# Patient Record
Sex: Female | Born: 1985
Health system: Southern US, Community
[De-identification: ages and names within clinical notes are randomized; demographics above are authoritative.]

## PROBLEM LIST (undated history)

## (undated) DIAGNOSIS — N83209 Unspecified ovarian cyst, unspecified side: Secondary | ICD-10-CM

## (undated) DIAGNOSIS — N838 Other noninflammatory disorders of ovary, fallopian tube and broad ligament: Secondary | ICD-10-CM

## (undated) DIAGNOSIS — F32A Depression, unspecified: Secondary | ICD-10-CM

## (undated) HISTORY — DX: Other noninflammatory disorders of ovary, fallopian tube and broad ligament: N83.8

## (undated) HISTORY — PX: OTHER SURGICAL HISTORY: SHX169

## (undated) HISTORY — PX: TUBAL LIGATION: SHX77

## (undated) HISTORY — DX: Unspecified ovarian cyst, unspecified side: N83.209

## (undated) HISTORY — DX: Depression, unspecified: F32.A

---

## 2006-07-11 ENCOUNTER — Inpatient Hospital Stay: Payer: Self-pay | Admitting: Unknown Physician Specialty

## 2006-07-20 ENCOUNTER — Ambulatory Visit: Payer: Self-pay | Admitting: Unknown Physician Specialty

## 2006-08-02 ENCOUNTER — Emergency Department: Payer: Self-pay | Admitting: Emergency Medicine

## 2007-02-24 ENCOUNTER — Emergency Department: Payer: Self-pay | Admitting: Emergency Medicine

## 2007-03-24 ENCOUNTER — Emergency Department: Payer: Self-pay | Admitting: Emergency Medicine

## 2007-06-19 ENCOUNTER — Ambulatory Visit: Payer: Self-pay | Admitting: Unknown Physician Specialty

## 2007-06-21 ENCOUNTER — Ambulatory Visit: Payer: Self-pay | Admitting: Unknown Physician Specialty

## 2007-06-30 ENCOUNTER — Emergency Department: Payer: Self-pay | Admitting: Emergency Medicine

## 2008-05-22 ENCOUNTER — Emergency Department: Payer: Self-pay | Admitting: Emergency Medicine

## 2009-07-31 ENCOUNTER — Emergency Department: Payer: Self-pay | Admitting: Unknown Physician Specialty

## 2010-01-24 ENCOUNTER — Inpatient Hospital Stay: Payer: Self-pay | Admitting: Specialist

## 2010-12-14 ENCOUNTER — Ambulatory Visit: Payer: Self-pay

## 2011-01-20 ENCOUNTER — Ambulatory Visit: Payer: Self-pay | Admitting: Family Medicine

## 2011-05-21 ENCOUNTER — Emergency Department: Payer: Self-pay | Admitting: Internal Medicine

## 2011-05-21 LAB — URINALYSIS, COMPLETE
Bilirubin,UR: NEGATIVE
Glucose,UR: NEGATIVE mg/dL (ref 0–75)
Hyaline Cast: 1
Ketone: NEGATIVE
Nitrite: NEGATIVE
Ph: 5 (ref 4.5–8.0)
Specific Gravity: 1.02 (ref 1.003–1.030)
Squamous Epithelial: NONE SEEN
WBC UR: 294 /HPF (ref 0–5)

## 2012-01-13 ENCOUNTER — Emergency Department: Payer: Self-pay | Admitting: Emergency Medicine

## 2012-02-07 ENCOUNTER — Emergency Department: Payer: Self-pay | Admitting: Emergency Medicine

## 2012-02-07 LAB — URINALYSIS, COMPLETE
Bacteria: NONE SEEN
Protein: NEGATIVE
RBC,UR: 6 /HPF (ref 0–5)
Specific Gravity: 1.021 (ref 1.003–1.030)
Squamous Epithelial: 3
WBC UR: 66 /HPF (ref 0–5)

## 2012-02-07 LAB — COMPREHENSIVE METABOLIC PANEL
Albumin: 3.3 g/dL — ABNORMAL LOW (ref 3.4–5.0)
Bilirubin,Total: 0.3 mg/dL (ref 0.2–1.0)
Calcium, Total: 8.7 mg/dL (ref 8.5–10.1)
Creatinine: 0.82 mg/dL (ref 0.60–1.30)
EGFR (African American): >60
Glucose: 119 mg/dL — ABNORMAL HIGH (ref 65–99)
SGOT(AST): 16 U/L (ref 15–37)
Sodium: 139 mmol/L (ref 136–145)
Total Protein: 8.4 g/dL — ABNORMAL HIGH (ref 6.4–8.2)

## 2012-02-07 LAB — CBC
MCH: 28.1 pg (ref 26.0–34.0)
MCHC: 32.9 g/dL (ref 32.0–36.0)
Platelet: 339 10*3/uL (ref 150–440)
RBC: 4.21 10*6/uL (ref 3.80–5.20)
RDW: 13.6 % (ref 11.5–14.5)
WBC: 14.2 10*3/uL — ABNORMAL HIGH (ref 3.6–11.0)

## 2012-02-07 LAB — PREGNANCY, URINE: Pregnancy Test, Urine: NEGATIVE m[IU]/mL

## 2014-07-31 ENCOUNTER — Encounter (HOSPITAL_COMMUNITY): Payer: Self-pay | Admitting: *Deleted

## 2014-07-31 ENCOUNTER — Emergency Department (HOSPITAL_COMMUNITY)
Admission: EM | Admit: 2014-07-31 | Discharge: 2014-07-31 | Disposition: A | Discharge status: Home / Self Care | Payer: No Typology Code available for payment source | Attending: Emergency Medicine | Admitting: Emergency Medicine

## 2014-07-31 ENCOUNTER — Emergency Department (HOSPITAL_COMMUNITY): Payer: No Typology Code available for payment source

## 2014-07-31 DIAGNOSIS — Z720 Tobacco use: Secondary | ICD-10-CM | POA: Diagnosis not present

## 2014-07-31 DIAGNOSIS — Y998 Other external cause status: Secondary | ICD-10-CM | POA: Diagnosis not present

## 2014-07-31 DIAGNOSIS — S199XXA Unspecified injury of neck, initial encounter: Secondary | ICD-10-CM | POA: Diagnosis not present

## 2014-07-31 DIAGNOSIS — S0990XA Unspecified injury of head, initial encounter: Secondary | ICD-10-CM | POA: Diagnosis not present

## 2014-07-31 DIAGNOSIS — M545 Low back pain, unspecified: Secondary | ICD-10-CM

## 2014-07-31 DIAGNOSIS — Y92410 Unspecified street and highway as the place of occurrence of the external cause: Secondary | ICD-10-CM | POA: Insufficient documentation

## 2014-07-31 DIAGNOSIS — Y9389 Activity, other specified: Secondary | ICD-10-CM | POA: Insufficient documentation

## 2014-07-31 DIAGNOSIS — R51 Headache: Secondary | ICD-10-CM

## 2014-07-31 DIAGNOSIS — S3992XA Unspecified injury of lower back, initial encounter: Secondary | ICD-10-CM | POA: Insufficient documentation

## 2014-07-31 DIAGNOSIS — R519 Headache, unspecified: Secondary | ICD-10-CM

## 2014-07-31 DIAGNOSIS — M542 Cervicalgia: Secondary | ICD-10-CM

## 2014-07-31 LAB — I-STAT BETA HCG BLOOD, ED (MC, WL, AP ONLY): I-stat hCG, quantitative: <5 m[IU]/mL (ref <5)

## 2014-07-31 MED ORDER — DIAZEPAM 5 MG PO TABS
5.0000 mg | ORAL_TABLET | Freq: Once | ORAL | Status: AC
  Administered 2014-07-31: 5 mg via ORAL
  Filled 2014-07-31: qty 1

## 2014-07-31 MED ORDER — KETOROLAC TROMETHAMINE 30 MG/ML IJ SOLN
60.0000 mg | Freq: Once | INTRAMUSCULAR | Status: AC
  Administered 2014-07-31: 60 mg via INTRAMUSCULAR
  Filled 2014-07-31 (×2): qty 2

## 2014-07-31 MED ORDER — HYDROCODONE-ACETAMINOPHEN 5-325 MG PO TABS: 1.0000 | ORAL_TABLET | Freq: Four times a day (QID) | ORAL | Status: DC | PRN

## 2014-07-31 MED ORDER — DIAZEPAM 5 MG PO TABS: 5.0000 mg | ORAL_TABLET | Freq: Four times a day (QID) | ORAL | Status: DC | PRN

## 2014-07-31 MED ORDER — HYDROCODONE-ACETAMINOPHEN 5-325 MG PO TABS
2.0000 | ORAL_TABLET | Freq: Once | ORAL | Status: AC
  Administered 2014-07-31: 2 via ORAL
  Filled 2014-07-31: qty 2

## 2014-07-31 MED ORDER — IBUPROFEN 800 MG PO TABS: 800.0000 mg | ORAL_TABLET | Freq: Three times a day (TID) | ORAL | Status: DC | PRN
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
## 2014-07-31 NOTE — ED Notes (Signed)
Patient transported to X-ray 

## 2014-07-31 NOTE — ED Notes (Signed)
Family at bedside. 
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
## 2014-08-12 ENCOUNTER — Encounter (HOSPITAL_COMMUNITY): Payer: Self-pay | Admitting: *Deleted

## 2014-08-12 ENCOUNTER — Emergency Department (HOSPITAL_COMMUNITY)
Admission: EM | Admit: 2014-08-12 | Discharge: 2014-08-12 | Disposition: A | Discharge status: Home / Self Care | Payer: BLUE CROSS/BLUE SHIELD | Attending: Emergency Medicine | Admitting: Emergency Medicine

## 2014-08-12 ENCOUNTER — Emergency Department (HOSPITAL_COMMUNITY): Payer: BLUE CROSS/BLUE SHIELD

## 2014-08-12 DIAGNOSIS — Y929 Unspecified place or not applicable: Secondary | ICD-10-CM | POA: Diagnosis not present

## 2014-08-12 DIAGNOSIS — S93402A Sprain of unspecified ligament of left ankle, initial encounter: Secondary | ICD-10-CM | POA: Insufficient documentation

## 2014-08-12 DIAGNOSIS — Y999 Unspecified external cause status: Secondary | ICD-10-CM | POA: Insufficient documentation

## 2014-08-12 DIAGNOSIS — Y939 Activity, unspecified: Secondary | ICD-10-CM | POA: Diagnosis not present

## 2014-08-12 DIAGNOSIS — Z720 Tobacco use: Secondary | ICD-10-CM | POA: Diagnosis not present

## 2014-08-12 DIAGNOSIS — X58XXXA Exposure to other specified factors, initial encounter: Secondary | ICD-10-CM | POA: Diagnosis not present

## 2014-08-12 DIAGNOSIS — S99912A Unspecified injury of left ankle, initial encounter: Secondary | ICD-10-CM | POA: Diagnosis present

## 2014-08-12 MED ORDER — HYDROCODONE-ACETAMINOPHEN 5-325 MG PO TABS: 2.0000 | ORAL_TABLET | ORAL | Status: DC | PRN

## 2014-08-12 NOTE — Discharge Instructions (Signed)

## 2014-08-12 NOTE — ED Notes (Signed)
Declined W/C at D/C and was escorted to lobby by RN. 
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
## 2015-02-15 ENCOUNTER — Emergency Department
Admission: EM | Admit: 2015-02-15 | Discharge: 2015-02-15 | Disposition: A | Discharge status: Home / Self Care | Payer: Self-pay | Attending: Emergency Medicine | Admitting: Emergency Medicine

## 2015-02-15 ENCOUNTER — Encounter: Payer: Self-pay | Admitting: Emergency Medicine

## 2015-02-15 DIAGNOSIS — Z720 Tobacco use: Secondary | ICD-10-CM | POA: Insufficient documentation

## 2015-02-15 DIAGNOSIS — J069 Acute upper respiratory infection, unspecified: Secondary | ICD-10-CM | POA: Insufficient documentation

## 2015-02-15 MED ORDER — GUAIFENESIN-CODEINE 100-10 MG/5ML PO SOLN: 10.0000 mL | Freq: Three times a day (TID) | ORAL | Status: DC | PRN

## 2015-02-15 NOTE — Discharge Instructions (Signed)
Upper Respiratory Infection, Adult Most upper respiratory infections (URIs) are a viral infection of the air passages leading to the lungs. A URI affects the nose, throat, and upper air passages. The most common type of URI is nasopharyngitis and is typically referred to as "the common cold." URIs run their course and usually go away on their own. Most of the time, a URI does not require medical attention, but sometimes a bacterial infection in the upper airways can follow a viral infection. This is called a secondary infection. Sinus and middle ear infections are common types of secondary upper respiratory infections. Bacterial pneumonia can also complicate a URI. A URI can worsen asthma and chronic obstructive pulmonary disease (COPD). Sometimes, these complications can require emergency medical care and may be life threatening.  CAUSES Almost all URIs are caused by viruses. A virus is a type of germ and can spread from one person to another.  RISKS FACTORS You may be at risk for a URI if:   You smoke.   You have chronic heart or lung disease.  You have a weakened defense (immune) system.   You are very young or very old.   You have nasal allergies or asthma.  You work in crowded or poorly ventilated areas.  You work in health care facilities or schools. SIGNS AND SYMPTOMS  Symptoms typically develop 2-3 days after you come in contact with a cold virus. Most viral URIs last 7-10 days. However, viral URIs from the influenza virus (flu virus) can last 14-18 days and are typically more severe. Symptoms may include:   Runny or stuffy (congested) nose.   Sneezing.   Cough.   Sore throat.   Headache.   Fatigue.   Fever.   Loss of appetite.   Pain in your forehead, behind your eyes, and over your cheekbones (sinus pain).  Muscle aches.  DIAGNOSIS  Your health care provider may diagnose a URI by:  Physical exam.  Tests to check that your symptoms are not due to  another condition such as:  Strep throat.  Sinusitis.  Pneumonia.  Asthma. TREATMENT  A URI goes away on its own with time. It cannot be cured with medicines, but medicines may be prescribed or recommended to relieve symptoms. Medicines may help:  Reduce your fever.  Reduce your cough.  Relieve nasal congestion. HOME CARE INSTRUCTIONS   Take medicines only as directed by your health care provider.   Gargle warm saltwater or take cough drops to comfort your throat as directed by your health care provider.  Use a warm mist humidifier or inhale steam from a shower to increase air moisture. This may make it easier to breathe.  Drink enough fluid to keep your urine clear or pale yellow.   Eat soups and other clear broths and maintain good nutrition.   Rest as needed.   Return to work when your temperature has returned to normal or as your health care provider advises. You may need to stay home longer to avoid infecting others. You can also use a face mask and careful hand washing to prevent spread of the virus.  Increase the usage of your inhaler if you have asthma.   Do not use any tobacco products, including cigarettes, chewing tobacco, or electronic cigarettes. If you need help quitting, ask your health care provider. PREVENTION  The best way to protect yourself from getting a cold is to practice good hygiene.   Avoid oral or hand contact with people with cold   symptoms.   Wash your hands often if contact occurs.  There is no clear evidence that vitamin C, vitamin E, echinacea, or exercise reduces the chance of developing a cold. However, it is always recommended to get plenty of rest, exercise, and practice good nutrition.  SEEK MEDICAL CARE IF:   You are getting worse rather than better.   Your symptoms are not controlled by medicine.   You have chills.  You have worsening shortness of breath.  You have brown or red mucus.  You have yellow or brown nasal  discharge.  You have pain in your face, especially when you bend forward.  You have a fever.  You have swollen neck glands.  You have pain while swallowing.  You have white areas in the back of your throat. SEEK IMMEDIATE MEDICAL CARE IF:   You have severe or persistent:  Headache.  Ear pain.  Sinus pain.  Chest pain.  You have chronic lung disease and any of the following:  Wheezing.  Prolonged cough.  Coughing up blood.  A change in your usual mucus.  You have a stiff neck.  You have changes in your:  Vision.  Hearing.  Thinking.  Mood. MAKE SURE YOU:   Understand these instructions.  Will watch your condition.  Will get help right away if you are not doing well or get worse.   This information is not intended to replace advice given to you by your health care provider. Make sure you discuss any questions you have with your health care provider.   Document Released: 09/21/2000 Document Revised: 08/12/2014 Document Reviewed: 07/03/2013 [employer]vier Interactive Patient Education 2016 [employer]  

## 2015-02-15 NOTE — ED Notes (Signed)
Developed fever   Body aches  And sore throat  For 2-3 days. Also has a cough

## 2015-02-15 NOTE — ED Notes (Signed)
Pt reports body aches since Thursday night with sore throat. States she has not had appetite and is able to sip on juice/water, but it hurts to swallow. NAD noted.

## 2015-02-15 NOTE — ED Provider Notes (Signed)
[facility] Emergency Department Provider Note ____________________________________________  Time seen: Approximately 8:42 AM  I have reviewed the triage vital signs and the nursing notes.   HISTORY  Chief Complaint Influenza    HPI Melissa Mcmillan is a 29 y.o. female who presents to the emergency department for evaluation of body aches, fever, sore throat and cough.   History reviewed. No pertinent past medical history.  There are no active problems to display for this patient.   History reviewed. No pertinent past surgical history.  Current Outpatient Rx  Name  Route  Sig  Dispense  Refill  . diazepam (VALIUM) 5 MG tablet   Oral   Take 1 tablet (5 mg total) by mouth every 6 (six) hours as needed for muscle spasms (and pain).   12 tablet   0   . guaiFENesin-codeine 100-10 MG/5ML syrup   Oral   Take 10 mLs by mouth 3 (three) times daily as needed.   120 mL   0   . HYDROcodone-acetaminophen (NORCO/VICODIN) 5-325 MG per tablet   Oral   Take 2 tablets by mouth every 4 (four) hours as needed.   10 tablet   0   . ibuprofen (ADVIL,MOTRIN) 800 MG tablet   Oral   Take 1 tablet (800 mg total) by mouth every 8 (eight) hours as needed for mild pain or moderate pain.   15 tablet   0   . Tetrahydrozoline HCl (VISINE OP)   Right Eye   Place 1 drop into the right eye daily as needed (dry eyes).           Allergies Review of patient's allergies indicates no known allergies.  No family history on file.  Social History Social History  Substance Use Topics  . Smoking status: Current Every Day Smoker  . Smokeless tobacco: None  . Alcohol Use: Yes    Review of Systems Constitutional: Positive for fever/chills Eyes: No visual changes. ENT: No sore throat. Cardiovascular: Denies chest pain. Respiratory: Negative for shortness of breath. Positive for cough. Gastrointestinal: Negative for abdominal pain. Negative for nausea,  negative for  vomiting.  Negative for diarrhea.  Genitourinary: Negative for dysuria. Musculoskeletal: Positive for body aches Skin: Negative for rash. Neurological: Positive for headaches, Negative for focal weakness or numbness.  10-point ROS otherwise negative.  ____________________________________________   PHYSICAL EXAM:  VITAL SIGNS: ED Triage Vitals  Enc Vitals Group     BP 02/15/15 0816 142/88 mmHg     Pulse Rate 02/15/15 0816 104     Resp 02/15/15 0816 20     Temp 02/15/15 0816 98.9 F (37.2 C)     Temp src --      SpO2 02/15/15 0816 99 %     Weight 02/15/15 0816 250 lb (113.399 kg)     Height 02/15/15 0816 6' (1.829 m)     Head Cir --      Peak Flow --      Pain Score 02/15/15 0812 8     Pain Loc --      Pain Edu? --      Excl. in GC? --     Constitutional: Alert and oriented. Well appearing and in no acute distress. Eyes: Conjunctivae are normal. PERRL. EOMI. Head: Atraumatic. Nose: No congestion/rhinnorhea. Mouth/Throat: Mucous membranes are moist.  Oropharynx erythematous with bilateral tonsillar edema--no exudate. Neck: No stridor.  Lymphatic: Anterior cervical lymphadenopathy. Cardiovascular: Normal rate, regular rhythm. Grossly normal heart sounds.  Good peripheral circulation. Respiratory:  Normal respiratory effort.  No retractions. Lungs clear to auscultation. Gastrointestinal: Soft and nontender. No distention. No abdominal bruits. No CVA tenderness. Musculoskeletal: No joint pain reported. Neurologic:  Normal speech and language. No gross focal neurologic deficits are appreciated. Speech is normal. No gait instability. Skin:  Skin is warm, dry and intact. No rash noted. Psychiatric: Mood and affect are normal. Speech and behavior are normal.  ____________________________________________   LABS (all labs ordered are listed, but only abnormal results are displayed)  Labs Reviewed - No data to  display ____________________________________________  EKG   ____________________________________________  RADIOLOGY   ____________________________________________   PROCEDURES  Procedure(s) performed: None  Critical Care performed: No  ____________________________________________   INITIAL IMPRESSION / ASSESSMENT AND PLAN / ED COURSE  Pertinent labs & imaging results that were available during my care of the patient were reviewed by me and considered in my medical decision making (see chart for details).   Patient was advised to follow up with the primary care provider for symptoms that are not improving over the next 2-3 days. She was advised to return to the emergency department for symptoms that change or worsen if unable to schedule an appointment with the primary care provider or specialist. ____________________________________________   FINAL CLINICAL IMPRESSION(S) / ED DIAGNOSES  Final diagnoses:  Upper respiratory infection, viral       Chinita Pester, FNP 02/15/15 1124  Sharyn Creamer, MD 02/15/15 1527

## 2015-02-15 NOTE — ED Notes (Signed)
POCT, rapid strep-negative results. Sample send to the lab.

## 2015-02-15 NOTE — ED Notes (Signed)
Pt discharged home after verbalizing understanding of discharge instructions; nad noted. 

## 2016-05-08 ENCOUNTER — Emergency Department
Admission: EM | Admit: 2016-05-08 | Discharge: 2016-05-08 | Disposition: A | Discharge status: Home / Self Care | Payer: Self-pay | Attending: Emergency Medicine | Admitting: Emergency Medicine

## 2016-05-08 ENCOUNTER — Emergency Department: Payer: Self-pay

## 2016-05-08 ENCOUNTER — Encounter: Payer: Self-pay | Admitting: Emergency Medicine

## 2016-05-08 DIAGNOSIS — Z87891 Personal history of nicotine dependence: Secondary | ICD-10-CM | POA: Insufficient documentation

## 2016-05-08 DIAGNOSIS — M5441 Lumbago with sciatica, right side: Secondary | ICD-10-CM

## 2016-05-08 DIAGNOSIS — M545 Low back pain: Secondary | ICD-10-CM | POA: Insufficient documentation

## 2016-05-08 DIAGNOSIS — G8929 Other chronic pain: Secondary | ICD-10-CM | POA: Insufficient documentation

## 2016-05-08 LAB — POCT PREGNANCY, URINE: PREG TEST UR: NEGATIVE

## 2016-05-08 MED ORDER — PREDNISONE 10 MG (21) PO TBPK: 10.0000 mg | ORAL_TABLET | Freq: Every day | ORAL | 0 refills | Status: DC

## 2016-05-08 NOTE — ED Provider Notes (Signed)
[facility] Emergency Department Provider Note  ____________________________________________  Time seen: Approximately 7:58 PM  I have reviewed the triage vital signs and the nursing notes.   HISTORY  Chief Complaint Back Pain    HPI Melissa Mcmillan is a 31 y.o. female presenting to the emergency department with chronic low back pain. Patient rates her aching low back pain at 8 out of 10 in intensity and describes it as radiating along the right posterior leg. She states that she has experienced low back pain since July of 2017. Patient states that she sustained a fall and followed up with [facility]. No fractures or bony abnormalities were identified on 10/18/2015 at [facility]. Patient states that she fell at work again last week and chronic low back pain has acutely worsened. Patient states that she has tried Flexeril and Percocet, which temporarily relieved her symptoms. She denies bowel or bladder incontinence, weakness, dysuria, hematuria, or increased urinary frequency. Patient denies history of nephrolithiasis.   History reviewed. No pertinent past medical history.  There are no active problems to display for this patient.   Past Surgical History:  Procedure Laterality Date  . TUBAL LIGATION      Prior to Admission medications   Medication Sig Start Date End Date Taking? Authorizing Provider  diazepam (VALIUM) 5 MG tablet Take 1 tablet (5 mg total) by mouth every 6 (six) hours as needed for muscle spasms (and pain). 07/31/14   Trixie Dredge, PA-C  guaiFENesin-codeine 100-10 MG/5ML syrup Take 10 mLs by mouth 3 (three) times daily as needed. 02/15/15   Chinita Pester, FNP  HYDROcodone-acetaminophen (NORCO/VICODIN) 5-325 MG per tablet Take 2 tablets by mouth every 4 (four) hours as needed. 08/12/14   Roxy Horseman, PA-C  ibuprofen (ADVIL,MOTRIN) 800 MG tablet Take 1 tablet (800 mg total) by mouth every 8 (eight) hours as needed for  mild pain or moderate pain. 07/31/14   Trixie Dredge, PA-C  predniSONE (STERAPRED UNI-PAK 21 TAB) 10 MG (21) TBPK tablet Take 1 tablet (10 mg total) by mouth daily. Take 6 tablets the first day, take 5 tablets the second day, take 4 tablets the third day, take 3 tablets the fourth day, take 2 tablets the 5th day, take 1 tablet the 6th day. 05/08/16   Orvil Feil, PA-C  Tetrahydrozoline HCl (VISINE OP) Place 1 drop into the right eye daily as needed (dry eyes).    Historical Provider, MD    Allergies Patient has no known allergies.  No family history on file.  Social History Social History  Substance Use Topics  . Smoking status: Former Games developer  . Smokeless tobacco: Never Used  . Alcohol use Yes     Review of Systems  Constitutional: No fever/chills Eyes: No visual changes. No discharge ENT: No upper respiratory complaints. Cardiovascular: no chest pain. Respiratory: no cough. No SOB. Gastrointestinal: No abdominal pain.  No nausea, no vomiting.  No diarrhea.  No constipation. Genitourinary: Negative for dysuria. No hematuria Musculoskeletal: Patient has low back pain.  Skin: Negative for rash, abrasions, lacerations, ecchymosis. Neurological: Negative for headaches, focal weakness or numbness.   ____________________________________________   PHYSICAL EXAM:  VITAL SIGNS: ED Triage Vitals  Enc Vitals Group     BP 05/08/16 1631 132/85     Pulse Rate 05/08/16 1631 89     Resp 05/08/16 1631 16     Temp 05/08/16 1631 98.1 F (36.7 C)     Temp Source 05/08/16 1631 Oral  SpO2 05/08/16 1631 96 %     Weight 05/08/16 1632 240 lb (108.9 kg)     Height 05/08/16 1632 6' (1.829 m)     Head Circumference --      Peak Flow --      Pain Score 05/08/16 1636 7     Pain Loc --      Pain Edu? --      Excl. in GC? --    Constitutional: Alert and oriented. Patient is talkative and engaged.  Eyes: Palpebral and bulbar conjunctiva are nonerythematous bilaterally. PERRL. EOMI.  Head:  Atraumatic. Cardiovascular: No pain with palpation over the anterior and posterior chest wall. Normal rate, regular rhythm. Normal S1 and S2. No murmurs, gallops or rubs auscultated.  Respiratory: Trachea is midline. No retractions or presence of deformity. Thoracic expansion is symmetric with unaccentuated tactile fremitus. Resonant and symmetric percussion tones bilaterally. On auscultation, adventitious sounds are absent.  Gastrointestinal:Abdomen is symmetric without striae or scars. No areas of visible pulsations or peristalsis. Active bowel sounds audible in all four quadrants. No friction rubs over liver or spleen auscultated. Percussion tones tympanic over epigastrium and resonant over remainder of abdomen. On inspiration, liver edge is firm, smooth and non-tender. No splenomegaly. Musculature soft and relaxed to light palpation. No masses or areas of tenderness to deep palpation. No costovertebral angle tenderness bilaterally.  Musculoskeletal: Patient has 5/5 strength in the upper and lower extremities bilaterally. Full range of motion at the shoulder, elbow and wrist bilaterally. Full range of motion at the hip, knee and ankle bilaterally. No changes in gait. Palpable radial, ulnar and dorsalis pedis pulses bilaterally and symmetrically. Patient's low back pain is intensified with extension at the spine. She has no pain with palpation along the cervical, lumbar and thoracic spine regions.  Neurologic: Normal speech and language. No gross focal neurologic deficits are appreciated. Cranial nerves: 2-10 normal as tested. Cerebellar: Finger-nose-finger WNL, heel to shin WNL. Sensorimotor: No sensory loss or abnormal reflexes. Vision: No visual field deficts noted to confrontation.  Speech: No dysarthria or expressive aphasia.  Skin:  Skin is warm, dry and intact. No rash or bruising noted.  Psychiatric: Mood and affect are normal for age. Speech and behavior are  normal.  ____________________________________________   LABS (all labs ordered are listed, but only abnormal results are displayed)  Labs Reviewed  POC URINE PREG, ED  POCT PREGNANCY, URINE   ____________________________________________  EKG   ____________________________________________  RADIOLOGY Geraldo PitterI, Francenia Chimenti M Odus Clasby, personally viewed and evaluated these images (plain radiographs) as part of my medical decision making, as well as reviewing the written report by the radiologist.  Dg Lumbar Spine 2-3 Views  Result Date: 05/08/2016 CLINICAL DATA:  Two falls over the past year with worsening back pain over the past 6 months. EXAM: LUMBAR SPINE - 2-3 VIEW COMPARISON:  07/31/2014 FINDINGS: Vertebral body alignment and heights are within normal. Subtle early spondylosis. Minimal facet arthropathy over the lower lumbar spine. Mild disc space narrowing at the L4-5 level unchanged. No compression fracture or subluxation. IMPRESSION: Subtle early spondylosis. Mild disc space narrowing at the L4-5 level unchanged. Electronically Signed   By: Elberta Fortisaniel  Boyle M.D.   On: 05/08/2016 18:16    ____________________________________________    PROCEDURES  Procedure(s) performed:    Procedures    Medications - No data to display   ____________________________________________   INITIAL IMPRESSION / ASSESSMENT AND PLAN / ED COURSE  Pertinent labs & imaging results that were available during my care of  the patient were reviewed by me and considered in my medical decision making (see chart for details).  Review of the [place] CSRS was performed in accordance of the [facility] prior to dispensing any controlled drugs.     Assessment and Plan:  Low Back Pain:  Patient presents to the emergency department with chronic low back pain with radiculopathy along a S1, S2, S3 dermatomal distribution. DG lumbar spine conducted in the emergency department revealed no fractures or bony abnormalities. Patient was  discharged with prednisone and referred to orthopedics, Dr. Joice Lofts. Patient's physical exam and vital signs are reassuring at this time. All patient questions were answered. ____________________________________________  FINAL CLINICAL IMPRESSION(S) / ED DIAGNOSES  Final diagnoses:  Acute midline low back pain with right-sided sciatica      NEW MEDICATIONS STARTED DURING THIS VISIT:  Discharge Medication List as of 05/08/2016  6:48 PM    START taking these medications   Details  predniSONE (STERAPRED UNI-PAK 21 TAB) 10 MG (21) TBPK tablet Take 1 tablet (10 mg total) by mouth daily. Take 6 tablets the first day, take 5 tablets the second day, take 4 tablets the third day, take 3 tablets the fourth day, take 2 tablets the 5th day, take 1 tablet the 6th day., Starting Sun 05/08/2016, Print            This chart was dictated using voice recognition software/Dragon. Despite best efforts to proofread, errors can occur which can change the meaning. Any change was purely unintentional.    Orvil Feil, PA-C 05/08/16 2011    Jene Every, MD 05/09/16 [phone]

## 2016-05-08 NOTE — ED Notes (Signed)
AAOx3.  Skin warm and dry. NAD. MAE equally and strong.  Ambulates with easy and steady gait.   

## 2016-05-08 NOTE — ED Triage Notes (Signed)
Pt comes into the ED via POV c/o lower back pain.  Patient has been seen by [facility] for the same issue and has found no relief.  Patient states this pain has been going on since September.  Patient has been taking flexeril and hydrocodone with no relief.  Patient denies any changes in her urine.  Patient in NAD at this time with even and unlabored respirations.  Patient states she has chronic back spasms.

## 2016-11-27 ENCOUNTER — Emergency Department
Admission: EM | Admit: 2016-11-27 | Discharge: 2016-11-27 | Disposition: A | Discharge status: Home / Self Care | Payer: Medicaid Other | Attending: Emergency Medicine | Admitting: Emergency Medicine

## 2016-11-27 DIAGNOSIS — X509XXA Other and unspecified overexertion or strenuous movements or postures, initial encounter: Secondary | ICD-10-CM | POA: Insufficient documentation

## 2016-11-27 DIAGNOSIS — Z87891 Personal history of nicotine dependence: Secondary | ICD-10-CM | POA: Insufficient documentation

## 2016-11-27 DIAGNOSIS — Y999 Unspecified external cause status: Secondary | ICD-10-CM | POA: Insufficient documentation

## 2016-11-27 DIAGNOSIS — S39012A Strain of muscle, fascia and tendon of lower back, initial encounter: Secondary | ICD-10-CM | POA: Insufficient documentation

## 2016-11-27 DIAGNOSIS — Y9383 Activity, rough housing and horseplay: Secondary | ICD-10-CM | POA: Insufficient documentation

## 2016-11-27 DIAGNOSIS — Y92003 Bedroom of unspecified non-institutional (private) residence as the place of occurrence of the external cause: Secondary | ICD-10-CM | POA: Insufficient documentation

## 2016-11-27 MED ORDER — KETOROLAC TROMETHAMINE 10 MG PO TABS: 10.0000 mg | ORAL_TABLET | Freq: Three times a day (TID) | ORAL | 0 refills | Status: DC

## 2016-11-27 MED ORDER — PREDNISONE 10 MG PO TABS: 10.0000 mg | ORAL_TABLET | Freq: Two times a day (BID) | ORAL | 0 refills | Status: DC

## 2016-11-27 MED ORDER — KETOROLAC TROMETHAMINE 60 MG/2ML IM SOLN
60.0000 mg | Freq: Once | INTRAMUSCULAR | Status: AC
  Administered 2016-11-27: 60 mg via INTRAMUSCULAR
  Filled 2016-11-27: qty 2

## 2016-11-27 MED ORDER — METAXALONE 800 MG PO TABS: 800.0000 mg | ORAL_TABLET | Freq: Three times a day (TID) | ORAL | 1 refills | Status: AC

## 2016-11-27 MED ORDER — ORPHENADRINE CITRATE 30 MG/ML IJ SOLN
60.0000 mg | INTRAMUSCULAR | Status: AC
  Administered 2016-11-27: 60 mg via INTRAMUSCULAR
  Filled 2016-11-27: qty 2

## 2016-11-27 NOTE — ED Triage Notes (Signed)
Pt states she injured back X 3 days ago while playing with nephews. Pain worse with movement.

## 2016-11-27 NOTE — Discharge Instructions (Signed)
Your exam is consistent with a lumbar strain. You do have some mild, underlying degenerative disc disease. Take the prescription meds as directed. Follow-up with [facility] for continued symptoms.

## 2016-11-27 NOTE — ED Provider Notes (Signed)
[facility] Emergency Department Provider Note ____________________________________________  Time seen: 1713  I have reviewed the triage vital signs and the nursing notes.  HISTORY  Chief Complaint  Back Pain  HPI Melissa Mcmillan is a 31 y.o. female resistant to the ED for evaluationof acute back pain after injury 3 days prior. Patient describes playing with her nephew, when she was sitting on the bed, he jumped on her back. She describes pain is worse with movement. She denies any distal paresthesias, foot drop, or incontinence. She has been taking ibuprofen sporadically in the interim for pain relief. She denies any other injury at this time.  History reviewed. No pertinent past medical history.  There are no active problems to display for this patient.  Past Surgical History:  Procedure Laterality Date  . TUBAL LIGATION      Prior to Admission medications   Medication Sig Start Date End Date Taking? Authorizing Provider  diazepam (VALIUM) 5 MG tablet Take 1 tablet (5 mg total) by mouth every 6 (six) hours as needed for muscle spasms (and pain). 07/31/14   Trixie Dredge, PA-C  guaiFENesin-codeine 100-10 MG/5ML syrup Take 10 mLs by mouth 3 (three) times daily as needed. 02/15/15   Triplett, Rulon Eisenmenger B, FNP  HYDROcodone-acetaminophen (NORCO/VICODIN) 5-325 MG per tablet Take 2 tablets by mouth every 4 (four) hours as needed. 08/12/14   Roxy Horseman, PA-C  ibuprofen (ADVIL,MOTRIN) 800 MG tablet Take 1 tablet (800 mg total) by mouth every 8 (eight) hours as needed for mild pain or moderate pain. 07/31/14   Trixie Dredge, PA-C  ketorolac (TORADOL) 10 MG tablet Take 1 tablet (10 mg total) by mouth every 8 (eight) hours. 11/27/16   Keelan Tripodi, Charlesetta Ivory, PA-C  metaxalone (SKELAXIN) 800 MG tablet Take 1 tablet (800 mg total) by mouth 3 (three) times daily. 11/27/16 12/27/16  Bessy Reaney, Charlesetta Ivory, PA-C  predniSONE (DELTASONE) 10 MG tablet Take 1 tablet (10 mg total) by mouth  2 (two) times daily with a meal. 11/27/16   Manolo Bosket, Charlesetta Ivory, PA-C  Tetrahydrozoline HCl (VISINE OP) Place 1 drop into the right eye daily as needed (dry eyes).    [provider]    Allergies Patient has no known allergies.  No family history on file.  Social History Social History  Substance Use Topics  . Smoking status: Former Games developer  . Smokeless tobacco: Never Used  . Alcohol use Yes    Review of Systems  Constitutional: Negative for fever. Cardiovascular: Negative for chest pain. Respiratory: Negative for shortness of breath. Gastrointestinal: Negative for abdominal pain, vomiting and diarrhea. Genitourinary: Negative for dysuria. Musculoskeletal: Positive for back pain. Neurological: Negative for headaches, focal weakness or numbness. ____________________________________________  PHYSICAL EXAM:  VITAL SIGNS: ED Triage Vitals [11/27/16 1618]  Enc Vitals Group     BP 138/86     Pulse Rate 84     Resp 18     Temp 98.8 F (37.1 C)     Temp Source Oral     SpO2 100 %     Weight 200 lb (90.7 kg)     Height 6' (1.829 m)     Head Circumference      Peak Flow      Pain Score 6     Pain Loc      Pain Edu?      Excl. in GC?    Constitutional: Alert and oriented. Well appearing and in no distress. Head: Normocephalic and atraumatic. Cardiovascular:  Normal rate, regular rhythm. Normal distal pulses. Respiratory: Normal respiratory effort. No wheezes/rales/rhonchi. Gastrointestinal: Soft and nontender. No distention. Musculoskeletal:Normal spinal alignment without midline tenderness, spasm, deformity, or step-off. Patient with slow transition from sit to stand. She localizes pain primarily to the mid back at the lumbar sacral junction. No paresthesias are elicited with palpation over the SI joints or the sacrum. She has a negative supine straight leg raise. Nontender with normal range of motion in all extremities.  Neurologic: Nerves II through XII  grossly intact. Normal LE DTRs bilaterally. Normal toe dorsiflexion and foot eversion. Antalgic gait without ataxia. Normal speech and language. No gross focal neurologic deficits are appreciated. ____________________________________________  PROCEDURES  Toradol 60 mg PO Norflex 60 mg IM ____________________________________________  INITIAL IMPRESSION / ASSESSMENT AND PLAN / ED COURSE  Patient was ED evaluation of acute lumbar strain.  Exam is overall benign without any acute neuromuscular deficit. She'll be discharged with prescriptions for ketorolac and Skelaxin to dose as directed. He is also given a prescription for prednisone to dose at a later time for any acute flare. She should follow-up with [place] [facility] for ongoing symptom management. ____________________________________________  FINAL CLINICAL IMPRESSION(S) / ED DIAGNOSES  Final diagnoses:  Strain of lumbar region, initial encounter      Lissa Hoard, PA-C 11/27/16 1905    Sharman Cheek, MD 11/27/16 2024

## 2016-11-27 NOTE — ED Notes (Signed)
Pt states had one side back x 3 days but now has pain both sides of her lower back. Injured playing with kids. Previous injury last year to her back. Does not have pcp. Is currently using cane to help walk.

## 2017-08-09 ENCOUNTER — Other Ambulatory Visit: Payer: Self-pay

## 2017-08-09 ENCOUNTER — Encounter: Payer: Self-pay | Admitting: Emergency Medicine

## 2017-08-09 ENCOUNTER — Emergency Department: Payer: Self-pay

## 2017-08-09 ENCOUNTER — Emergency Department
Admission: EM | Admit: 2017-08-09 | Discharge: 2017-08-09 | Disposition: A | Discharge status: Home / Self Care | Payer: Self-pay | Attending: Emergency Medicine | Admitting: Emergency Medicine

## 2017-08-09 DIAGNOSIS — Z87891 Personal history of nicotine dependence: Secondary | ICD-10-CM | POA: Insufficient documentation

## 2017-08-09 DIAGNOSIS — Z79899 Other long term (current) drug therapy: Secondary | ICD-10-CM | POA: Insufficient documentation

## 2017-08-09 DIAGNOSIS — N7011 Chronic salpingitis: Secondary | ICD-10-CM | POA: Insufficient documentation

## 2017-08-09 DIAGNOSIS — N12 Tubulo-interstitial nephritis, not specified as acute or chronic: Secondary | ICD-10-CM | POA: Insufficient documentation

## 2017-08-09 LAB — URINALYSIS, COMPLETE (UACMP) WITH MICROSCOPIC
Bacteria, UA: NONE SEEN
Bilirubin Urine: NEGATIVE
GLUCOSE, UA: NEGATIVE mg/dL
Ketones, ur: NEGATIVE mg/dL
NITRITE: NEGATIVE
PROTEIN: 100 mg/dL — AB
RBC / HPF: >50 RBC/hpf — ABNORMAL HIGH (ref 0–5)
SPECIFIC GRAVITY, URINE: 1.017 (ref 1.005–1.030)
pH: 6 (ref 5.0–8.0)

## 2017-08-09 LAB — POC URINE PREG, ED: PREG TEST UR: NEGATIVE

## 2017-08-09 MED ORDER — CEFTRIAXONE SODIUM 1 G IJ SOLR
1.0000 g | Freq: Once | INTRAMUSCULAR | Status: AC
  Administered 2017-08-09: 1 g via INTRAMUSCULAR
  Filled 2017-08-09: qty 10

## 2017-08-09 MED ORDER — CEPHALEXIN 500 MG PO CAPS: 500.0000 mg | ORAL_CAPSULE | Freq: Two times a day (BID) | ORAL | 0 refills | Status: AC

## 2017-08-09 NOTE — ED Triage Notes (Signed)
Pt c/o dysuria x 2 days, c/o low back pain beginning last night.

## 2017-08-09 NOTE — ED Notes (Signed)
Pt states she is having back pain with some burning and pain with urination. Pt states she is still able to pass urine without complications.

## 2017-08-09 NOTE — ED Provider Notes (Signed)
[facility] Emergency Department Provider Note  ____________________________________________  Time seen: Approximately 2:55 PM  I have reviewed the triage vital signs and the nursing notes.   HISTORY  Chief Complaint Dysuria    HPI Melissa Mcmillan is a 32 y.o. female presents to the emergency department for evaluation of dysuria for 2 days and right-sided back pain since last night.  She has been taking Azo tablets. No concern for STDs. She has had tubal ligation surgery.  Patient denies fever, chills, nausea, vomiting, abdominal pain, vaginal discharge.  History reviewed. No pertinent past medical history.  There are no active problems to display for this patient.   Past Surgical History:  Procedure Laterality Date  . TUBAL LIGATION      Prior to Admission medications   Medication Sig Start Date End Date Taking? Authorizing Provider  cephALEXin (KEFLEX) 500 MG capsule Take 1 capsule (500 mg total) by mouth 2 (two) times daily for 10 days. 08/09/17 08/19/17  Enid Derry, PA-C  diazepam (VALIUM) 5 MG tablet Take 1 tablet (5 mg total) by mouth every 6 (six) hours as needed for muscle spasms (and pain). 07/31/14   Trixie Dredge, PA-C  guaiFENesin-codeine 100-10 MG/5ML syrup Take 10 mLs by mouth 3 (three) times daily as needed. 02/15/15   Triplett, Rulon Eisenmenger B, FNP  HYDROcodone-acetaminophen (NORCO/VICODIN) 5-325 MG per tablet Take 2 tablets by mouth every 4 (four) hours as needed. 08/12/14   Roxy Horseman, PA-C  ibuprofen (ADVIL,MOTRIN) 800 MG tablet Take 1 tablet (800 mg total) by mouth every 8 (eight) hours as needed for mild pain or moderate pain. 07/31/14   Trixie Dredge, PA-C  ketorolac (TORADOL) 10 MG tablet Take 1 tablet (10 mg total) by mouth every 8 (eight) hours. 11/27/16   Menshew, Charlesetta Ivory, PA-C  predniSONE (DELTASONE) 10 MG tablet Take 1 tablet (10 mg total) by mouth 2 (two) times daily with a meal. 11/27/16   Menshew, Charlesetta Ivory, PA-C   Tetrahydrozoline HCl (VISINE OP) Place 1 drop into the right eye daily as needed (dry eyes).    [provider]    Allergies Patient has no known allergies.  History reviewed. No pertinent family history.  Social History Social History   Tobacco Use  . Smoking status: Former Games developer  . Smokeless tobacco: Never Used  Substance Use Topics  . Alcohol use: Yes  . Drug use: No     Review of Systems  Constitutional: No fever/chills Respiratory: No SOB. Gastrointestinal: No abdominal pain.  No nausea, no vomiting.  Genitourinary:  Positive for dysuria. Musculoskeletal: Negative for musculoskeletal pain. Skin: Negative for rash, abrasions, lacerations, ecchymosis.    ____________________________________________   PHYSICAL EXAM:  VITAL SIGNS: ED Triage Vitals  Enc Vitals Group     BP 08/09/17 1220 (!) 150/89     Pulse Rate 08/09/17 1220 88     Resp 08/09/17 1220 17     Temp 08/09/17 1220 98.5 F (36.9 C)     Temp Source 08/09/17 1220 Oral     SpO2 08/09/17 1220 98 %     Weight 08/09/17 1219 230 lb (104.3 kg)     Height 08/09/17 1219 6' (1.829 m)     Head Circumference --      Peak Flow --      Pain Score 08/09/17 1228 8     Pain Loc --      Pain Edu? --      Excl. in GC? --  Constitutional: Alert and oriented. Well appearing and in no acute distress. Eyes: Conjunctivae are normal. PERRL. EOMI. Head: Atraumatic. ENT:      Ears:      Nose: No congestion/rhinnorhea.      Mouth/Throat: Mucous membranes are moist.  Neck: No stridor.   Cardiovascular: Normal rate, regular rhythm.  Good peripheral circulation. Respiratory: Normal respiratory effort without tachypnea or retractions. Lungs CTAB. Good air entry to the bases with no decreased or absent breath sounds. Gastrointestinal: Bowel sounds 4 quadrants. Soft and nontender to palpation. No guarding or rigidity. No palpable masses. No distention. No CVA tenderness. Musculoskeletal: Full range of  motion to all extremities. No gross deformities appreciated. Neurologic:  Normal speech and language. No gross focal neurologic deficits are appreciated.  Skin:  Skin is warm, dry and intact. No rash noted. Psychiatric: Mood and affect are normal. Speech and behavior are normal. Patient exhibits appropriate insight and judgement.   ____________________________________________   LABS (all labs ordered are listed, but only abnormal results are displayed)  Labs Reviewed  URINALYSIS, COMPLETE (UACMP) WITH MICROSCOPIC - Abnormal; Notable for the following components:      Result Value   Color, Urine YELLOW (*)    APPearance CLOUDY (*)    Hgb urine dipstick LARGE (*)    Protein, ur 100 (*)    Leukocytes, UA LARGE (*)    RBC / HPF >50 (*)    WBC, UA >50 (*)    All other components within normal limits  URINE CULTURE  POC URINE PREG, ED   ____________________________________________  EKG   ____________________________________________  RADIOLOGY Lexine Baton, personally viewed and evaluated these images (plain radiographs) as part of my medical decision making, as well as reviewing the written report by the radiologist.  Ct Renal Stone Study  Result Date: 08/09/2017 CLINICAL DATA:  Dysuria and hematuria for 2 days, low back pain. EXAM: CT ABDOMEN AND PELVIS WITHOUT CONTRAST TECHNIQUE: Multidetector CT imaging of the abdomen and pelvis was performed following the standard protocol without IV contrast. COMPARISON:  Pelvic ultrasound February 08, 2012 FINDINGS: LOWER CHEST: Lung bases are clear. The visualized heart size is normal. No pericardial effusion. HEPATOBILIARY: Normal. PANCREAS: Normal. SPLEEN: Normal. ADRENALS/URINARY TRACT: Kidneys are orthotopic, demonstrating normal size and morphology. No nephrolithiasis; limited assessment for renal masses on this nonenhanced examination. Mild LEFT hydroureteronephrosis with periureteral fat stranding. Urinary bladder is partially distended  with disproportionate wall thickening and periapical vesicular fat stranding. Normal adrenal glands. STOMACH/BOWEL: The stomach, small and large bowel are normal in course and caliber without inflammatory changes, sensitivity decreased by lack of enteric contrast. Normal appendix. VASCULAR/LYMPHATIC: Aortoiliac vessels are normal in course and caliber. No lymphadenopathy by CT size criteria. REPRODUCTIVE: Tubular fluid-filled structure RIGHT pelvis measuring to 2.4 cm. Small benign-appearing LEFT adnexal cyst. OTHER: No intraperitoneal free fluid or free air. MUSCULOSKELETAL: Non-acute. Mild degenerative change of the cervical spine including L2-3 and L3-4 small broad-based disc osteophyte complex. Congenital canal narrowing. Mild levoscoliosis may be positional. Small fat containing umbilical hernia. IMPRESSION: 1. Mild LEFT hydroureteronephrosis with inflammation concerning for urinary tract infection/pyelonephritis. Suspected cystitis. No nephrolithiasis. 2. RIGHT hydrosalpinx, less likely focal fluid-filled small bowel. Electronically Signed   By: Awilda Metro M.D.   On: 08/09/2017 14:50    ____________________________________________    PROCEDURES  Procedure(s) performed:    Procedures    Medications  cefTRIAXone (ROCEPHIN) injection 1 g (1 g Intramuscular Given 08/09/17 1533)     ____________________________________________   INITIAL IMPRESSION / ASSESSMENT AND  PLAN / ED COURSE  Pertinent labs & imaging results that were available during my care of the patient were reviewed by me and considered in my medical decision making (see chart for details).  Review of the [place] CSRS was performed in accordance of the [facility] prior to dispensing any controlled drugs.  Patient's diagnosis is consistent with pyelonephritis and hydrosalphinx. Vital signs and exam are reassuring. Urinalysis consistent with infection. CT consistent with left sided pyelonephritis and hydrosalpix. Patient will  follow up with gynecology for hydroslpinx. She denies any symptoms related to this. Patient appears well. She was given IM Ceftriaxone. Patient will be discharged home with prescriptions for keflex. Patient is to follow up with PCP and gynecology as directed. Patient is given ED precautions to return to the ED for any worsening or new symptoms.     ____________________________________________  FINAL CLINICAL IMPRESSION(S) / ED DIAGNOSES  Final diagnoses:  Pyelonephritis  Hydrosalpinx      NEW MEDICATIONS STARTED DURING THIS VISIT:  ED Discharge Orders        Ordered    cephALEXin (KEFLEX) 500 MG capsule  2 times daily     08/09/17 1519          This chart was dictated using voice recognition software/Dragon. Despite best efforts to proofread, errors can occur which can change the meaning. Any change was purely unintentional.    Enid Derry, PA-C 08/09/17 1616    Don Perking, [place], MD 08/14/17 2023

## 2017-08-10 LAB — URINE CULTURE: Culture: NO GROWTH

## 2017-08-15 ENCOUNTER — Emergency Department
Admission: EM | Admit: 2017-08-15 | Discharge: 2017-08-15 | Discharge status: Against Medical Advice | Payer: Medicaid Other

## 2017-08-15 ENCOUNTER — Ambulatory Visit
Admission: EM | Admit: 2017-08-15 | Discharge: 2017-08-15 | Disposition: A | Discharge status: Home / Self Care | Payer: Medicaid Other | Attending: Family Medicine | Admitting: Family Medicine

## 2017-08-15 ENCOUNTER — Other Ambulatory Visit: Payer: Self-pay

## 2017-08-15 DIAGNOSIS — N7011 Chronic salpingitis: Secondary | ICD-10-CM | POA: Insufficient documentation

## 2017-08-15 DIAGNOSIS — R300 Dysuria: Secondary | ICD-10-CM | POA: Diagnosis not present

## 2017-08-15 DIAGNOSIS — Z87440 Personal history of urinary (tract) infections: Secondary | ICD-10-CM | POA: Insufficient documentation

## 2017-08-15 DIAGNOSIS — N760 Acute vaginitis: Secondary | ICD-10-CM | POA: Diagnosis not present

## 2017-08-15 DIAGNOSIS — Z87891 Personal history of nicotine dependence: Secondary | ICD-10-CM | POA: Insufficient documentation

## 2017-08-15 DIAGNOSIS — A5900 Urogenital trichomoniasis, unspecified: Secondary | ICD-10-CM

## 2017-08-15 DIAGNOSIS — Z113 Encounter for screening for infections with a predominantly sexual mode of transmission: Secondary | ICD-10-CM

## 2017-08-15 DIAGNOSIS — R319 Hematuria, unspecified: Secondary | ICD-10-CM | POA: Insufficient documentation

## 2017-08-15 DIAGNOSIS — B9689 Other specified bacterial agents as the cause of diseases classified elsewhere: Secondary | ICD-10-CM

## 2017-08-15 DIAGNOSIS — N1330 Unspecified hydronephrosis: Secondary | ICD-10-CM | POA: Insufficient documentation

## 2017-08-15 DIAGNOSIS — M545 Low back pain: Secondary | ICD-10-CM | POA: Insufficient documentation

## 2017-08-15 DIAGNOSIS — R350 Frequency of micturition: Secondary | ICD-10-CM

## 2017-08-15 DIAGNOSIS — A5901 Trichomonal vulvovaginitis: Secondary | ICD-10-CM

## 2017-08-15 DIAGNOSIS — R3915 Urgency of urination: Secondary | ICD-10-CM | POA: Insufficient documentation

## 2017-08-15 LAB — CBC WITH DIFFERENTIAL/PLATELET
Basophils Absolute: 0.1 10*3/uL (ref 0–0.1)
Basophils Relative: 1 %
Eosinophils Absolute: 0.1 10*3/uL (ref 0–0.7)
Eosinophils Relative: 2 %
HEMATOCRIT: 39.8 % (ref 35.0–47.0)
HEMOGLOBIN: 13.5 g/dL (ref 12.0–16.0)
LYMPHS ABS: 1.8 10*3/uL (ref 1.0–3.6)
Lymphocytes Relative: 22 %
MCH: 29.9 pg (ref 26.0–34.0)
MCHC: 33.9 g/dL (ref 32.0–36.0)
MCV: 88.3 fL (ref 80.0–100.0)
MONO ABS: 0.7 10*3/uL (ref 0.2–0.9)
MONOS PCT: 8 %
NEUTROS ABS: 5.7 10*3/uL (ref 1.4–6.5)
NEUTROS PCT: 67 %
Platelets: 352 10*3/uL (ref 150–440)
RBC: 4.5 MIL/uL (ref 3.80–5.20)
RDW: 13.3 % (ref 11.5–14.5)
WBC: 8.4 10*3/uL (ref 3.6–11.0)

## 2017-08-15 LAB — HCG, QUANTITATIVE, PREGNANCY: hCG, Beta Chain, Quant, S: <1 m[IU]/mL (ref <5)

## 2017-08-15 LAB — BASIC METABOLIC PANEL
ANION GAP: 9 (ref 5–15)
BUN: 12 mg/dL (ref 6–20)
CHLORIDE: 104 mmol/L (ref 101–111)
CO2: 23 mmol/L (ref 22–32)
Calcium: 9 mg/dL (ref 8.9–10.3)
Creatinine, Ser: 0.83 mg/dL (ref 0.44–1.00)
GFR calc non Af Amer: >60 mL/min (ref >60)
Glucose, Bld: 99 mg/dL (ref 65–99)
Potassium: 3.7 mmol/L (ref 3.5–5.1)
SODIUM: 136 mmol/L (ref 135–145)

## 2017-08-15 LAB — URINALYSIS, COMPLETE (UACMP) WITH MICROSCOPIC
Bilirubin Urine: NEGATIVE
GLUCOSE, UA: NEGATIVE mg/dL
Ketones, ur: NEGATIVE mg/dL
NITRITE: NEGATIVE
Protein, ur: 100 mg/dL — AB
RBC / HPF: >50 RBC/hpf (ref 0–5)
SPECIFIC GRAVITY, URINE: 1.01 (ref 1.005–1.030)
pH: 6.5 (ref 5.0–8.0)

## 2017-08-15 LAB — CHLAMYDIA/NGC RT PCR (ARMC ONLY)
Chlamydia Tr: NOT DETECTED
N gonorrhoeae: NOT DETECTED

## 2017-08-15 LAB — WET PREP, GENITAL
SPERM: NONE SEEN
WBC, Wet Prep HPF POC: NONE SEEN
YEAST WET PREP: NONE SEEN

## 2017-08-15 MED ORDER — METRONIDAZOLE 500 MG PO TABS: 500.0000 mg | ORAL_TABLET | Freq: Two times a day (BID) | ORAL | 0 refills | Status: AC

## 2017-08-15 NOTE — ED Triage Notes (Addendum)
Pt states she was treated for UTI/Pyelonephritis  last week. No more back pain, but still having painful urination and hematuria. Pain 8/10 with urination. Urine Culture came back no growth. Pt reports she has 3 more days on Keflex

## 2017-08-15 NOTE — Discharge Instructions (Addendum)
Take medication as prescribed. Rest. Drink plenty of fluids.  No sexual activity until follow-up and cleared.  Follow-up with gynecology this week as soon as possible.  Follow up with your primary care physician this week as needed. Return to Urgent care as needed.  For any worsening complaints, fevers or pain proceed directly to the emergency room as discussed.

## 2017-08-15 NOTE — ED Provider Notes (Addendum)
[facility] ____________________________________________  Time seen: Approximately 1330 PM  I have reviewed the triage vital signs and the nursing notes.   HISTORY  Chief Complaint Dysuria  HPI Melissa Mcmillan is a 32 y.o. female presenting for evaluation of continued urinary frequency and urinary urgency.  Patient reports this is been present for 1 week.  States that she was seen in the emergency room 6 days ago for the same complaints and at that time she was told that she had a UTI as well as pyelonephritis.  Also reports that she was told that she had fluid on her right fallopian tube but needed to have follow-up with gynecology.  Patient reports she has not had any follow-up at this point.  Has been treated with Keflex twice a day as well as a shot of an antibiotic per patient in ER.  Reports the initial back pain that she was having has since resolved, but continues with urinary complaints.  Denies abdominal pain, back pain, fevers, vomiting, diarrhea, vaginal discharge.  Reports sexually active with her same partner for 4 years.  Denies concerns of STDs.  Reports does not use birth control or barrier contraception.  Reports otherwise feels well.  Denies pelvic pain or pelvic discomfort.  Patient's last menstrual period was 07/25/2017 (approximate).  Reports had some vaginal spotting today.  History reviewed. No pertinent past medical history.  There are no active problems to display for this patient.   Past Surgical History:  Procedure Laterality Date  . TUBAL LIGATION       No current facility-administered medications for this encounter.   Current Outpatient Medications:  .  cephALEXin (KEFLEX) 500 MG capsule, Take 1 capsule (500 mg total) by mouth 2 (two) times daily for 10 days., Disp: 20 capsule, Rfl: 0 .  metroNIDAZOLE (FLAGYL) 500 MG tablet, Take 1 tablet (500 mg total) by mouth 2 (two) times daily for 7 days., Disp: 14 tablet, Rfl: 0  Allergies Patient  has no known allergies.  History reviewed. No pertinent family history.  Social History Social History   Tobacco Use  . Smoking status: Former Games developer  . Smokeless tobacco: Never Used  Substance Use Topics  . Alcohol use: Yes    Comment: social  . Drug use: No    Review of Systems Constitutional: No fever/chills ENT: No sore throat. Cardiovascular: Denies chest pain. Respiratory: Denies shortness of breath. Gastrointestinal: No abdominal pain.  No nausea, no vomiting.  No diarrhea.  No constipation. Genitourinary: positive for dysuria. Musculoskeletal: Negative for back pain. Skin: Negative for rash.   ____________________________________________   PHYSICAL EXAM:  VITAL SIGNS: ED Triage Vitals  Enc Vitals Group     BP 08/15/17 1246 (!) 155/94     Pulse Rate 08/15/17 1246 73     Resp 08/15/17 1246 18     Temp 08/15/17 1246 98.1 F (36.7 C)     Temp Source 08/15/17 1246 Oral     SpO2 08/15/17 1246 100 %     Weight 08/15/17 1252 230 lb (104.3 kg)     Height 08/15/17 1252 6' (1.829 m)     Head Circumference --      Peak Flow --      Pain Score 08/15/17 1251 8     Pain Loc --      Pain Edu? --      Excl. in GC? --     Constitutional: Alert and oriented. Well appearing and in no acute distress. ENT  Head: Normocephalic and atraumatic. Cardiovascular: Normal rate, regular rhythm. Grossly normal heart sounds.  Good peripheral circulation. Respiratory: Normal respiratory effort without tachypnea nor retractions. Breath sounds are clear and equal bilaterally. No wheezes, rales, rhonchi. Gastrointestinal: Soft and nontender. Normal Bowel sounds. No CVA tenderness. Pelvic: Exam completed with Betsy RN at bedside as chaperone External: Normal appearance, no rash or lesion.  Speculum: Mild amount bleeding, no discharge, no lesions noted.  Bimanual: Nontender, no cervical or adnexal tenderness bilaterally. Musculoskeletal:  Nontender with normal range of motion in all  extremities. No midline cervical, thoracic or lumbar tenderness to palpation. Bilateral pedal pulses equal and easily palpated. Neurologic:  Normal speech and language. No gross focal neurologic deficits are appreciated. Speech is normal. No gait instability.  Skin:  Skin is warm, dry and intact. No rash noted. Psychiatric: Mood and affect are normal. Speech and behavior are normal. Patient exhibits appropriate insight and judgment   ___________________________________________   LABS (all labs ordered are listed, but only abnormal results are displayed)  Labs Reviewed  WET PREP, GENITAL - Abnormal; Notable for the following components:      Result Value   Trich, Wet Prep PRESENT (*)    Clue Cells Wet Prep HPF POC PRESENT (*)    All other components within normal limits  URINALYSIS, COMPLETE (UACMP) WITH MICROSCOPIC - Abnormal; Notable for the following components:   APPearance HAZY (*)    Hgb urine dipstick LARGE (*)    Protein, ur 100 (*)    Leukocytes, UA MODERATE (*)    Bacteria, UA FEW (*)    All other components within normal limits  URINE CULTURE  CHLAMYDIA/NGC RT PCR ([facility] ONLY)  HCG, QUANTITATIVE, PREGNANCY  CBC WITH DIFFERENTIAL/PLATELET  BASIC METABOLIC PANEL  HIV ANTIBODY (ROUTINE TESTING)  HSV(HERPES SIMPLEX VRS) I + II AB-IGG  HSV(HERPES SIMPLEX VRS) I + II AB-IGM  HEPATITIS PANEL, ACUTE  RPR    RADIOLOGY  No results found.  CLINICAL DATA:  Dysuria and hematuria for 2 days, low back pain.  EXAM: CT ABDOMEN AND PELVIS WITHOUT CONTRAST  TECHNIQUE: Multidetector CT imaging of the abdomen and pelvis was performed following the standard protocol without IV contrast.  COMPARISON:  Pelvic ultrasound February 08, 2012  FINDINGS: LOWER CHEST: Lung bases are clear. The visualized heart size is normal. No pericardial effusion.  HEPATOBILIARY: Normal.  PANCREAS: Normal.  SPLEEN: Normal.  ADRENALS/URINARY TRACT: Kidneys are orthotopic,  demonstrating normal size and morphology. No nephrolithiasis; limited assessment for renal masses on this nonenhanced examination. Mild LEFT hydroureteronephrosis with periureteral fat stranding. Urinary bladder is partially distended with disproportionate wall thickening and periapical vesicular fat stranding. Normal adrenal glands.  STOMACH/BOWEL: The stomach, small and large bowel are normal in course and caliber without inflammatory changes, sensitivity decreased by lack of enteric contrast. Normal appendix.  VASCULAR/LYMPHATIC: Aortoiliac vessels are normal in course and caliber. No lymphadenopathy by CT size criteria.  REPRODUCTIVE: Tubular fluid-filled structure RIGHT pelvis measuring to 2.4 cm. Small benign-appearing LEFT adnexal cyst.  OTHER: No intraperitoneal free fluid or free air.  MUSCULOSKELETAL: Non-acute. Mild degenerative change of the cervical spine including L2-3 and L3-4 small broad-based disc osteophyte complex. Congenital canal narrowing. Mild levoscoliosis may be positional. Small fat containing umbilical hernia.  IMPRESSION: 1. Mild LEFT hydroureteronephrosis with inflammation concerning for urinary tract infection/pyelonephritis. Suspected cystitis. No nephrolithiasis. 2. RIGHT hydrosalpinx, less likely focal fluid-filled small bowel.   Electronically Signed   By: Awilda Metro M.D.   On: 08/09/2017 14:50 CT reviewed from  above. PROCEDURES Procedures    INITIAL IMPRESSION / ASSESSMENT AND PLAN / ED COURSE  Pertinent labs & imaging results that were available during my care of the patient were reviewed by me and considered in my medical decision making (see chart for details).  Very well-appearing patient.  No acute distress.  Denies pain at this time.  Presented for urinary frequency and urgency.  Urinalysis reviewed, discussed with patient not clear UTI but few bacteria still present.  Also trichomonas present.  Will also evaluate  labs and serum pregnancy, as well as complete pelvic exam.  Pelvic exam nontender.  Wet prep did return positive for trichomonas as well as bacterial vaginosis.  Will await urine culture prior to initiating further antibiotic therapy as patient is currently still on Keflex and has reported 3 more days.  Labs also reviewed and hCG negative.  Will treat with oral Flagyl twice daily x7 days.  Discussed no sexual activity until follow-up.  Counseled strict need to follow-up with OB/GYN as well, and encouraged follow-up this week.  Patient also requests further STD screening and evaluation, labs obtained.  Discussed for any fevers, pain or worsening concerns proceed directly to the emergency room.Discussed indication, risks and benefits of medications with patient.  Discussed follow up with Primary care physician this week. Discussed follow up and return parameters including no resolution or any worsening concerns. Patient verbalized understanding and agreed to plan.   ____________________________________________   FINAL CLINICAL IMPRESSION(S) / ED DIAGNOSES  Final diagnoses:  Dysuria  Trichomonas vaginitis  Bacterial vaginosis     ED Discharge Orders        Ordered    metroNIDAZOLE (FLAGYL) 500 MG tablet  2 times daily     08/15/17 1508       Note: This dictation was prepared with Dragon dictation along with smaller phrase technology. Any transcriptional errors that result from this process are unintentional.         Renford Dills, NP 08/15/17 1825    Renford Dills, NP 08/15/17 [phone]

## 2017-08-16 LAB — URINE CULTURE: Culture: NO GROWTH

## 2017-08-16 LAB — HEPATITIS PANEL, ACUTE
HEP B S AG: NEGATIVE
Hep A IgM: NEGATIVE
Hep B C IgM: NEGATIVE

## 2017-08-16 LAB — RPR: RPR: NONREACTIVE

## 2017-08-16 LAB — HSV(HERPES SIMPLEX VRS) I + II AB-IGG: HSV 2 Glycoprotein G Ab, IgG: 9.73 index — ABNORMAL HIGH (ref 0.00–0.90)

## 2017-08-16 LAB — HIV ANTIBODY (ROUTINE TESTING W REFLEX): HIV Screen 4th Generation wRfx: NONREACTIVE

## 2017-08-17 LAB — HSV(HERPES SIMPLEX VRS) I + II AB-IGM: HSVI/II Comb IgM: <0.91 Ratio (ref 0.00–0.90)

## 2017-08-18 ENCOUNTER — Telehealth (HOSPITAL_COMMUNITY): Payer: Self-pay

## 2017-08-18 NOTE — Telephone Encounter (Signed)
Test for herpes is positive, all other testing is negative. Attempted to reach patient. No answer. Will call back again at a later date.

## 2017-08-21 ENCOUNTER — Telehealth (HOSPITAL_COMMUNITY): Payer: Self-pay

## 2017-08-21 NOTE — Telephone Encounter (Signed)
Attempted to reach patient x 2 regarding results. No answer. Message sent to MyChart.

## 2018-02-19 ENCOUNTER — Other Ambulatory Visit: Payer: Self-pay

## 2018-02-19 ENCOUNTER — Emergency Department: Payer: Self-pay

## 2018-02-19 ENCOUNTER — Emergency Department
Admission: EM | Admit: 2018-02-19 | Discharge: 2018-02-19 | Disposition: A | Discharge status: Home / Self Care | Payer: Self-pay | Attending: Emergency Medicine | Admitting: Emergency Medicine

## 2018-02-19 DIAGNOSIS — S39012A Strain of muscle, fascia and tendon of lower back, initial encounter: Secondary | ICD-10-CM | POA: Insufficient documentation

## 2018-02-19 DIAGNOSIS — M62830 Muscle spasm of back: Secondary | ICD-10-CM | POA: Insufficient documentation

## 2018-02-19 DIAGNOSIS — W19XXXA Unspecified fall, initial encounter: Secondary | ICD-10-CM | POA: Insufficient documentation

## 2018-02-19 DIAGNOSIS — Y929 Unspecified place or not applicable: Secondary | ICD-10-CM | POA: Insufficient documentation

## 2018-02-19 DIAGNOSIS — Y939 Activity, unspecified: Secondary | ICD-10-CM | POA: Insufficient documentation

## 2018-02-19 DIAGNOSIS — Z87891 Personal history of nicotine dependence: Secondary | ICD-10-CM | POA: Insufficient documentation

## 2018-02-19 DIAGNOSIS — Y999 Unspecified external cause status: Secondary | ICD-10-CM | POA: Insufficient documentation

## 2018-02-19 MED ORDER — METHOCARBAMOL 500 MG PO TABS: 500.0000 mg | ORAL_TABLET | Freq: Four times a day (QID) | ORAL | 0 refills | Status: DC

## 2018-02-19 MED ORDER — ORPHENADRINE CITRATE 30 MG/ML IJ SOLN
60.0000 mg | Freq: Once | INTRAMUSCULAR | Status: AC
  Administered 2018-02-19: 60 mg via INTRAMUSCULAR
  Filled 2018-02-19: qty 2

## 2018-02-19 MED ORDER — KETOROLAC TROMETHAMINE 30 MG/ML IJ SOLN
30.0000 mg | Freq: Once | INTRAMUSCULAR | Status: AC
  Administered 2018-02-19: 30 mg via INTRAMUSCULAR
  Filled 2018-02-19: qty 1

## 2018-02-19 MED ORDER — MELOXICAM 15 MG PO TABS: 15.0000 mg | ORAL_TABLET | Freq: Every day | ORAL | 0 refills | Status: AC

## 2018-02-19 NOTE — ED Triage Notes (Signed)
Pt states she fell a week ago and states over the past couple of days having severe spasms in the lower back .

## 2018-02-19 NOTE — ED Provider Notes (Signed)
[facility] Emergency Department Provider Note  ____________________________________________  Time seen: Approximately 6:37 PM  I have reviewed the triage vital signs and the nursing notes.   HISTORY  Chief Complaint Back Pain    HPI Melissa Mcmillan is a 32 y.o. female who presents the emergency department complaining of lower back pain.  Patient reports that approximately a week ago she fell, landing directly on her back.  Patient reports that she had mild pain to the region, some intermittent tightness to the region as well.  Over the intervening time, patient has developed increasing pain, increasing muscle spasms, radiation of the pain from her lower back into the left leg.  Patient denies any other injury.  She did not hit her head or lose consciousness in the fall.  Patient denies any headache, visual changes, neck pain, chest pain, shortness of breath, abdominal pain, urinary symptoms, GI symptoms.  No medications for this complaint prior to arrival.  No other complaints.    History reviewed. No pertinent past medical history.  There are no active problems to display for this patient.   Past Surgical History:  Procedure Laterality Date  . TUBAL LIGATION      Prior to Admission medications   Medication Sig Start Date End Date Taking? Authorizing Provider  meloxicam (MOBIC) 15 MG tablet Take 1 tablet (15 mg total) by mouth daily. 02/19/18   Hensley Treat, Delorise Royals, PA-C  methocarbamol (ROBAXIN) 500 MG tablet Take 1 tablet (500 mg total) by mouth 4 (four) times daily. 02/19/18   Maximo Spratling, Delorise Royals, PA-C    Allergies Patient has no known allergies.  No family history on file.  Social History Social History   Tobacco Use  . Smoking status: Former Games developer  . Smokeless tobacco: Never Used  Substance Use Topics  . Alcohol use: Yes    Comment: social  . Drug use: No     Review of Systems  Constitutional: No fever/chills Eyes: No visual  changes. No discharge ENT: No upper respiratory complaints. Cardiovascular: no chest pain. Respiratory: no cough. No SOB. Gastrointestinal: No abdominal pain.  No nausea, no vomiting.  No diarrhea.  No constipation. Genitourinary: Negative for dysuria. No hematuria Musculoskeletal: Positive for lower back pain, positive for lower back spasms. Skin: Negative for rash, abrasions, lacerations, ecchymosis. Neurological: Negative for headaches, focal weakness or numbness. 10-point ROS otherwise negative.  ____________________________________________   PHYSICAL EXAM:  VITAL SIGNS: ED Triage Vitals  Enc Vitals Group     BP 02/19/18 1751 (!) 146/100     Pulse Rate 02/19/18 1751 89     Resp 02/19/18 1751 20     Temp 02/19/18 1751 99 F (37.2 C)     Temp Source 02/19/18 1751 Oral     SpO2 02/19/18 1751 98 %     Weight 02/19/18 1752 250 lb (113.4 kg)     Height 02/19/18 1752 6' (1.829 m)     Head Circumference --      Peak Flow --      Pain Score 02/19/18 1802 10     Pain Loc --      Pain Edu? --      Excl. in GC? --      Constitutional: Alert and oriented. Well appearing and in no acute distress. Eyes: Conjunctivae are normal. PERRL. EOMI. Head: Atraumatic. ENT:      Ears:       Nose: No congestion/rhinnorhea.      Mouth/Throat: Mucous membranes are moist.  Neck: No stridor.  No cervical spine tenderness to palpation.  Cardiovascular: Normal rate, regular rhythm. Normal S1 and S2.  Good peripheral circulation. Respiratory: Normal respiratory effort without tachypnea or retractions. Lungs CTAB. Good air entry to the bases with no decreased or absent breath sounds. Gastrointestinal: Bowel sounds 4 quadrants. Soft and nontender to palpation. No guarding or rigidity. No palpable masses. No distention. No CVA tenderness. Musculoskeletal: Full range of motion to all extremities. No gross deformities appreciated.  Visualization of the lumbar spine reveals no visible signs of trauma.   Patient has limited range of motion due to pain.  Patient is nontender to palpation midline but is diffusely tender palpation bilateral paraspinal muscle groups with worse tenderness on the left.  Palpable spasms are appreciated.  Patient is mildly tender to palpation left sciatic notch.  Negative straight leg raise bilaterally.  Dorsalis pedis pulse intact bilateral lower extremities.  Sensation intact and equal bilateral lower extremities. Neurologic:  Normal speech and language. No gross focal neurologic deficits are appreciated.  Skin:  Skin is warm, dry and intact. No rash noted. Psychiatric: Mood and affect are normal. Speech and behavior are normal. Patient exhibits appropriate insight and judgement.   ____________________________________________   LABS (all labs ordered are listed, but only abnormal results are displayed)  Labs Reviewed - No data to display ____________________________________________  EKG   ____________________________________________  RADIOLOGY I personally viewed and evaluated these images as part of my medical decision making, as well as reviewing the written report by the radiologist.  I concur with radiologist finding of no acute osseous abnormality to the lumbar spine.  Dg Lumbar Spine Complete  Result Date: 02/19/2018 CLINICAL DATA:  Fall with low back pain EXAM: LUMBAR SPINE - COMPLETE 4+ VIEW COMPARISON:  CT 08/09/2017 FINDINGS: There is no evidence of lumbar spine fracture. Alignment is normal. Mild degenerative change L2-L3. IMPRESSION: No acute osseous abnormality. Electronically Signed   By: Jasmine Pang M.D.   On: 02/19/2018 19:21    ____________________________________________    PROCEDURES  Procedure(s) performed:    Procedures    Medications  ketorolac (TORADOL) 30 MG/ML injection 30 mg (30 mg Intramuscular Given 02/19/18 1959)  orphenadrine (NORFLEX) injection 60 mg (60 mg Intramuscular Given 02/19/18 1959)      ____________________________________________   INITIAL IMPRESSION / ASSESSMENT AND PLAN / ED COURSE  Pertinent labs & imaging results that were available during my care of the patient were reviewed by me and considered in my medical decision making (see chart for details).  Review of the [place] CSRS was performed in accordance of the [facility] prior to dispensing any controlled drugs.      Patient's diagnosis is consistent with lumbar strain and muscle spasm.  Patient presents emergency department after a fall a week ago.  Initially, patient was mostly asymptomatic with some mild pain.  Patient developed increasing lower back pain, spasm sensation.  X-rays revealed no acute osseous abnormality.  Exam was otherwise reassuring.  Follow-up with primary care as needed.  Patient will be given Toradol and Norflex injection in the emergency department.  Patient will be discharged with prescriptions for meloxicam and Robaxin. Patient is given ED precautions to return to the ED for any worsening or new symptoms.     ____________________________________________  FINAL CLINICAL IMPRESSION(S) / ED DIAGNOSES  Final diagnoses:  Strain of lumbar region, initial encounter  Muscle spasm of back      NEW MEDICATIONS STARTED DURING THIS VISIT:  ED Discharge Orders  Ordered    methocarbamol (ROBAXIN) 500 MG tablet  4 times daily     02/19/18 2004    meloxicam (MOBIC) 15 MG tablet  Daily     02/19/18 2004              This chart was dictated using voice recognition software/Dragon. Despite best efforts to proofread, errors can occur which can change the meaning. Any change was purely unintentional.    Racheal Patches, PA-C 02/19/18 2017    Myrna Blazer, MD 02/19/18 2217

## 2018-02-19 NOTE — ED Notes (Signed)
See triage note  Presents with pain to lower back  Describes pain as "spasm like"  Also states pain radiates into left leg  States she fell on Friday  And pain has gotten worse since

## 2019-01-05 IMAGING — CR DG LUMBAR SPINE 2-3V
1 series · 3 of 3 positions shown · non-contrast
Comparison: 07/31/2014

CLINICAL DATA: Two falls over the past year with worsening back
pain over the past 6 months.

EXAM:
LUMBAR SPINE - 2-3 VIEW

[Series 1: dg lumbar spine 2-3 views · 0.14mm/px · 3 of 3 slices shown]
[im 1/3]
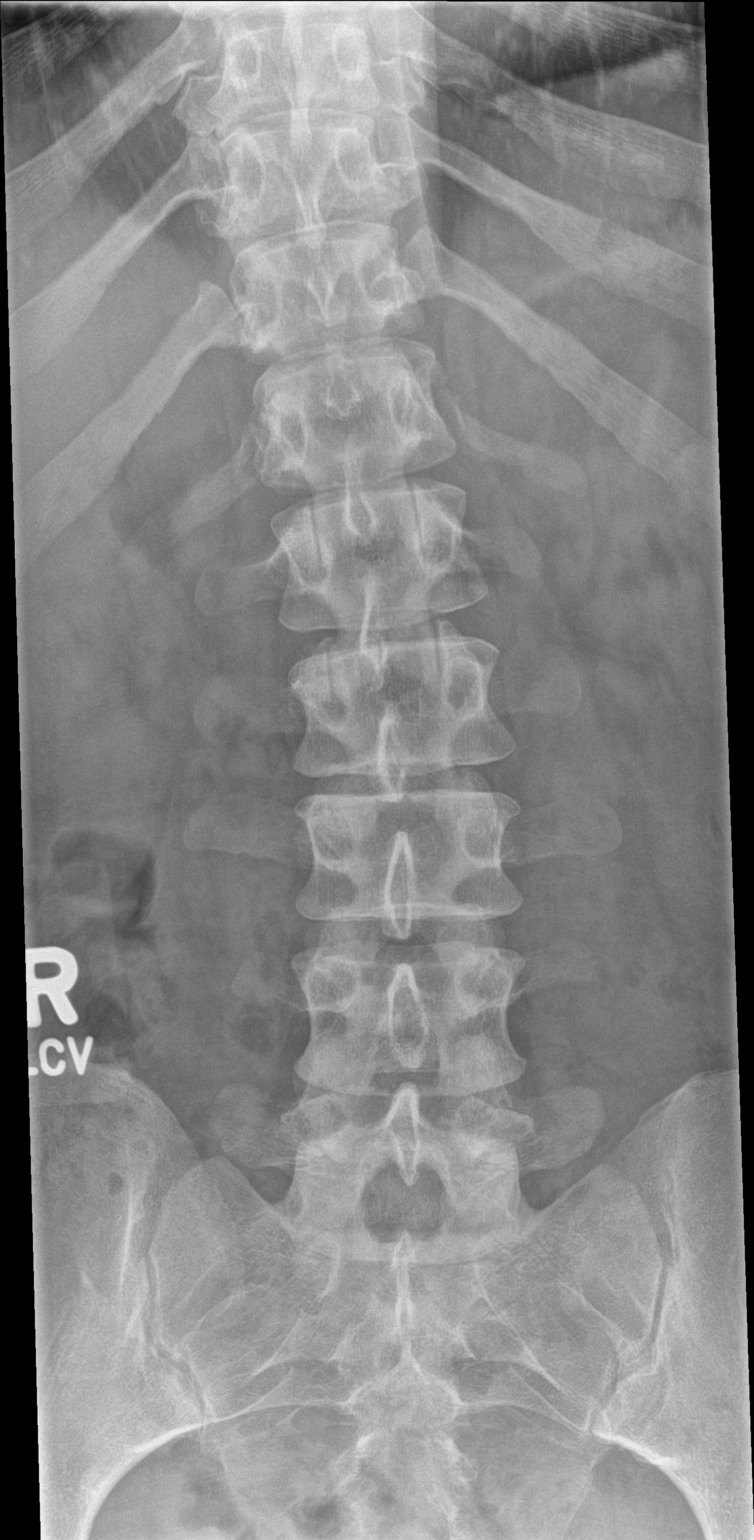
[im 2/3]
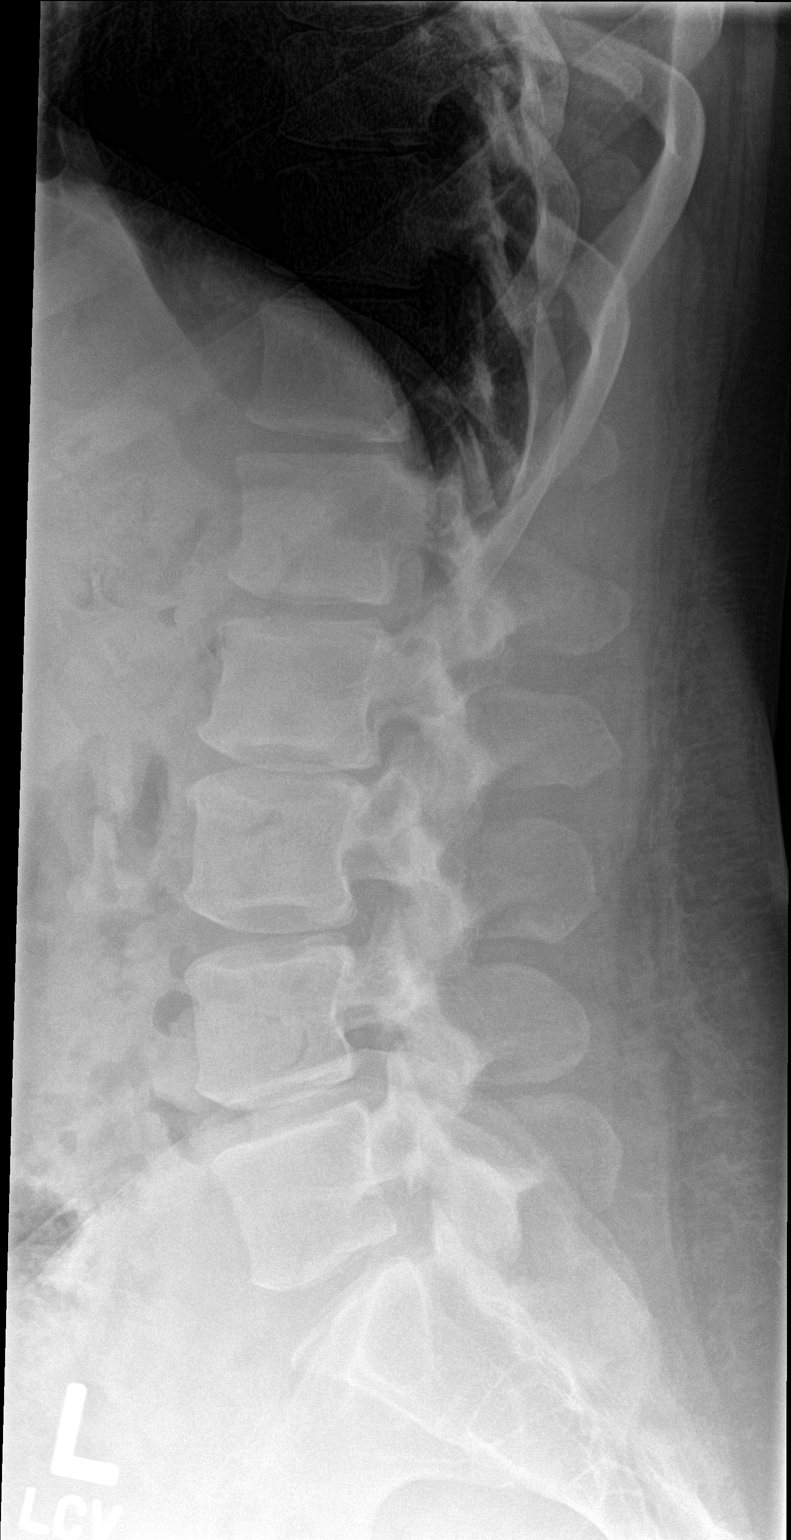
[im 3/3]
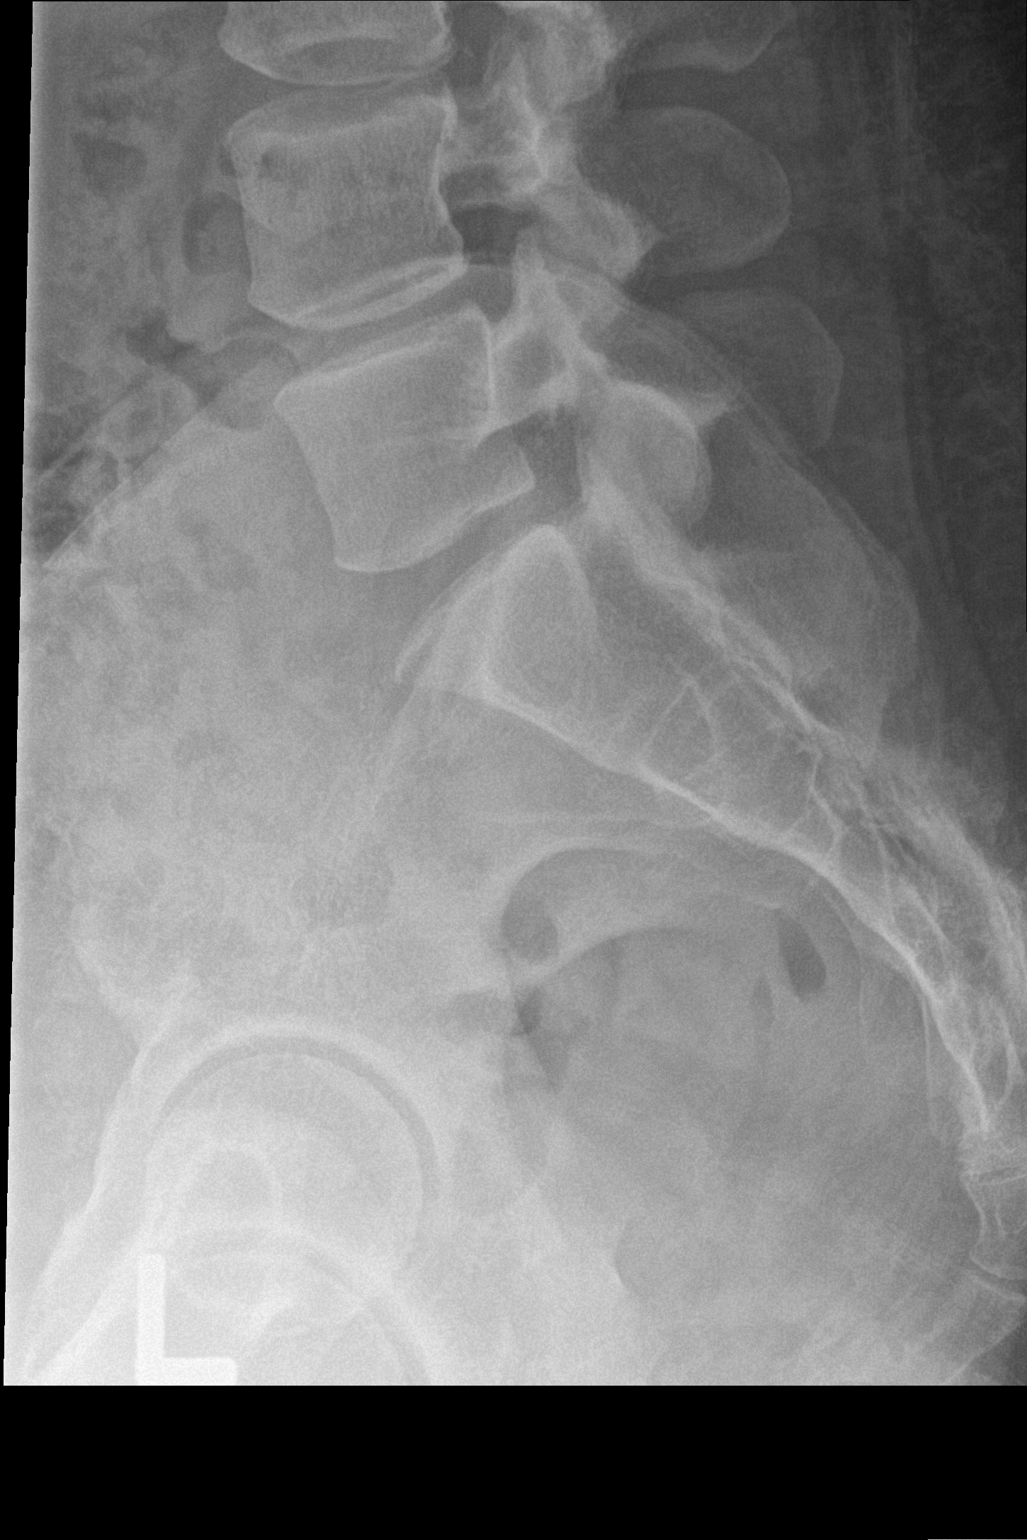

[3 of 3 positions shown; findings below may reference images not displayed]

FINDINGS: Vertebral body alignment and heights are within normal. Subtle early
spondylosis. Minimal facet arthropathy over the lower lumbar spine.
Mild disc space narrowing at the L4-5 level unchanged. No
compression fracture or subluxation.
IMPRESSION: Subtle early spondylosis. Mild disc space narrowing at the L4-5
level unchanged.

## 2020-04-07 IMAGING — CT CT RENAL STONE PROTOCOL
2 of 4 series · 16 of 46 positions shown, 18 images · non-contrast
Comparison: Pelvic ultrasound February 08, 2012

CLINICAL DATA: Dysuria and hematuria for 2 days, low back pain.

EXAM:
CT ABDOMEN AND PELVIS WITHOUT CONTRAST
TECHNIQUE: Multidetector CT imaging of the abdomen and pelvis was performed
following the standard protocol without IV contrast.

[Series 2: stone full standard · axial · 0.80mm/px · z∈[-1056,-596]mm · 13 of 101 slices shown, 15 images]
[im 5/101  soft-tissue]
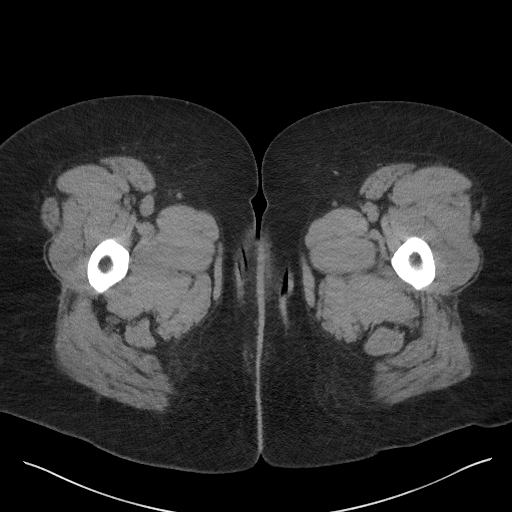
[im 5/101  bone]
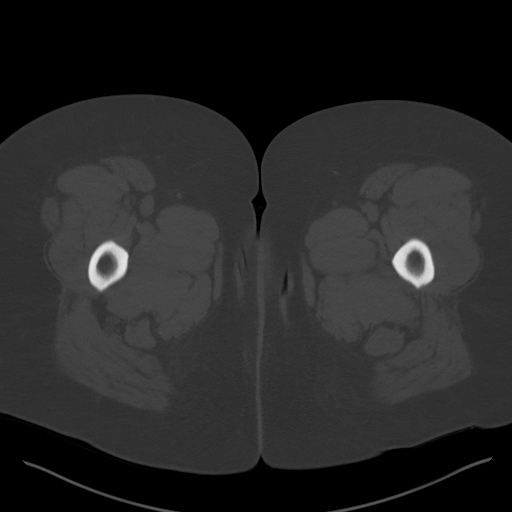
[im 13/101  soft-tissue]
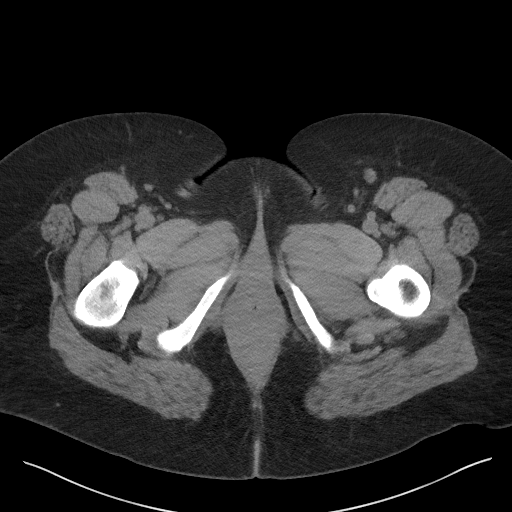
[im 21/101  soft-tissue]
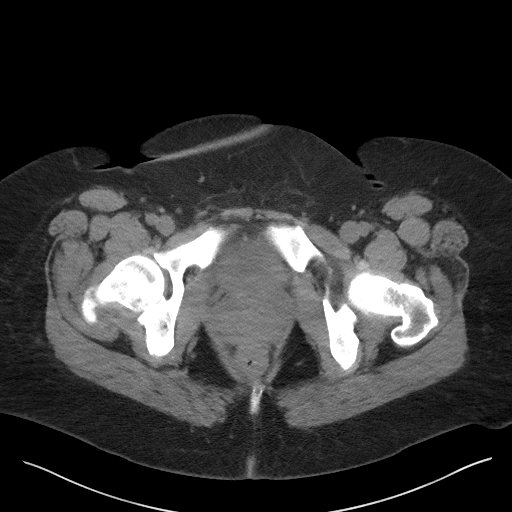
[im 29/101  soft-tissue]
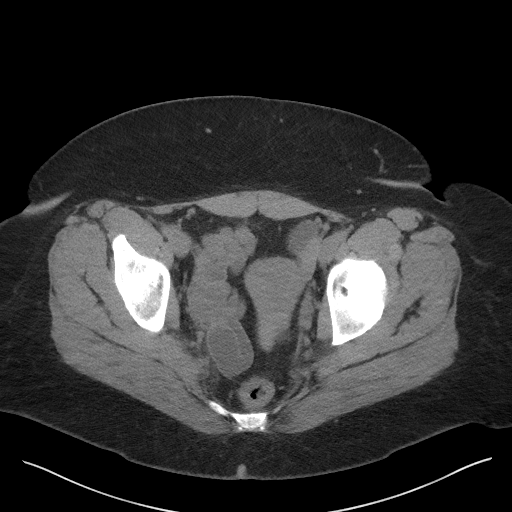
[im 37/101  soft-tissue]
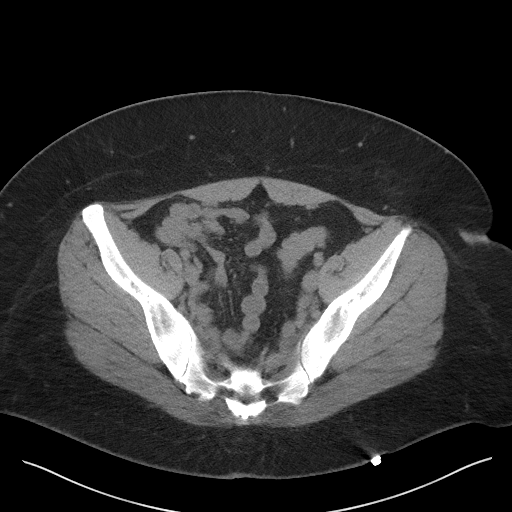
[im 45/101  soft-tissue]
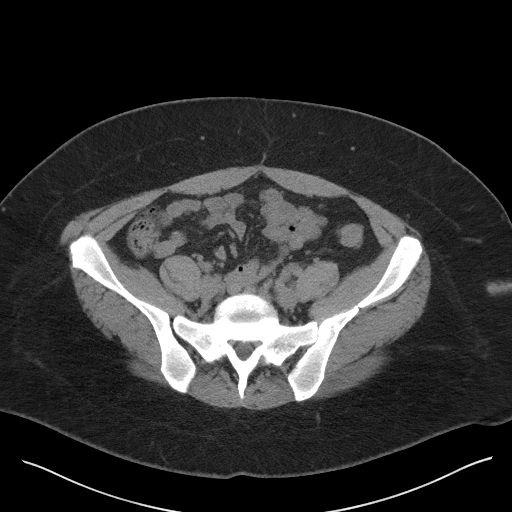
[im 53/101  soft-tissue]
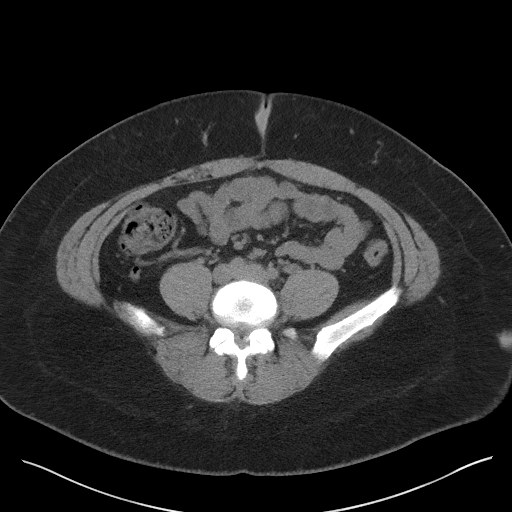
[im 57/101  soft-tissue]
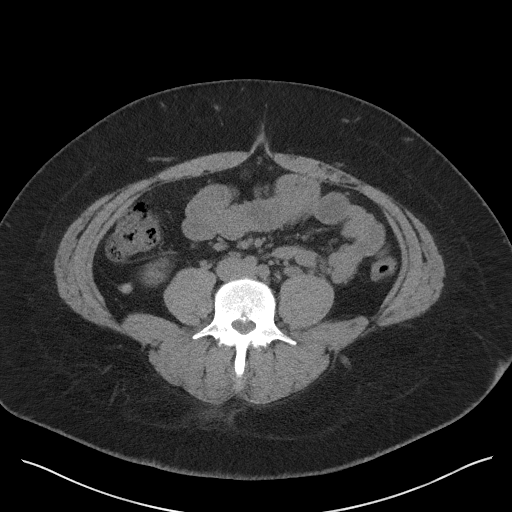
[im 65/101  soft-tissue]
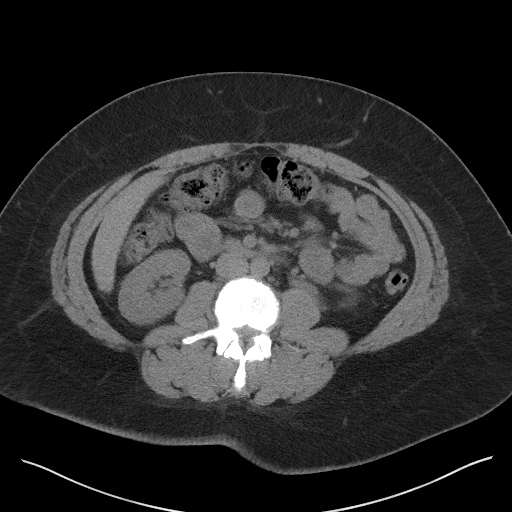
[im 65/101  bone]
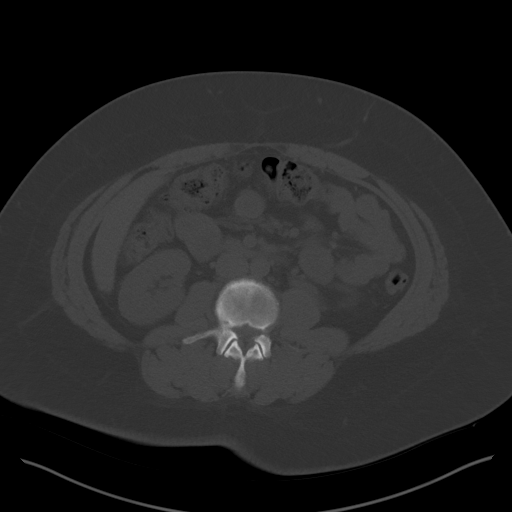
[im 73/101  soft-tissue]
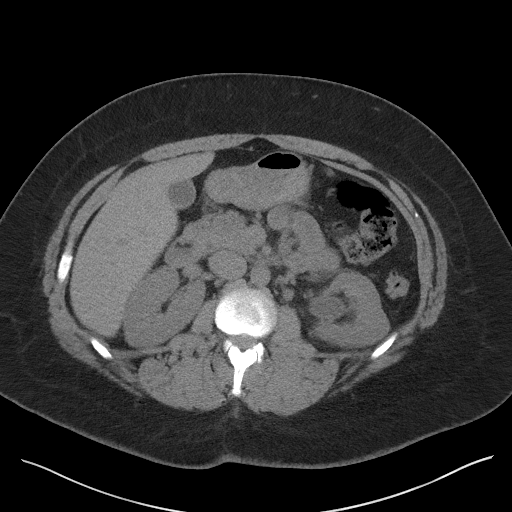
[im 81/101  soft-tissue]
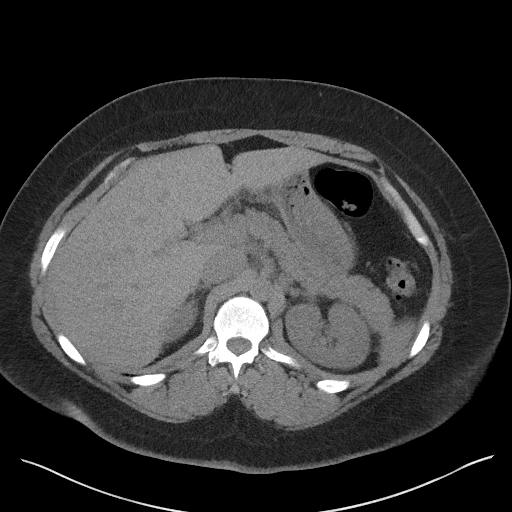
[im 89/101  soft-tissue]
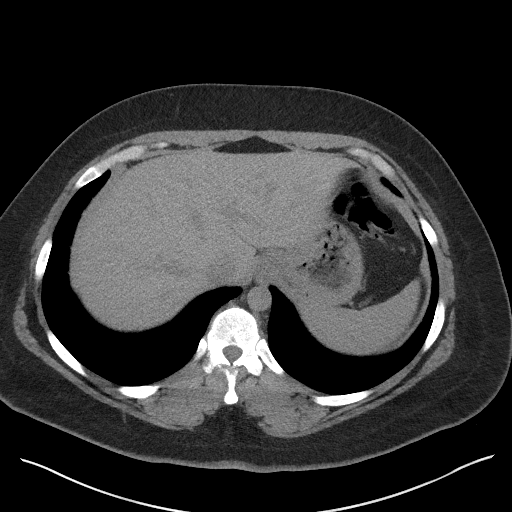
[im 97/101  soft-tissue]
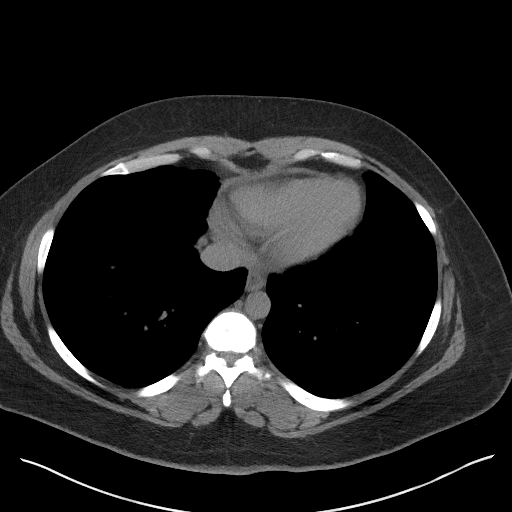

[Series 5: coronal · coronal · 0.76mm/px · 3 of 122 slices shown]
[im 41/122  soft-tissue]
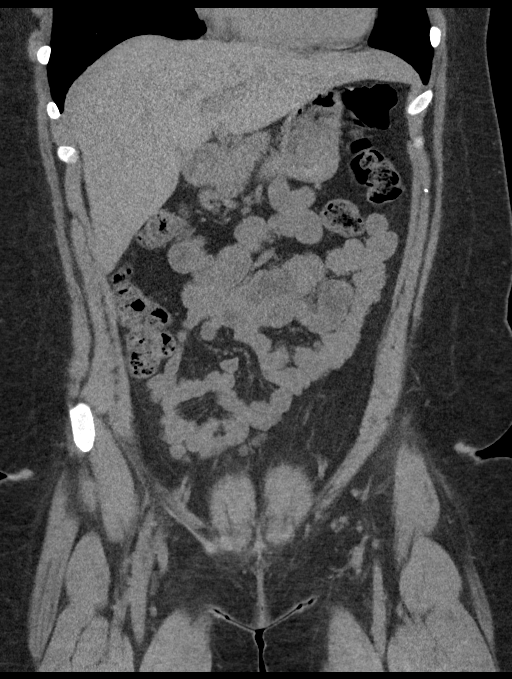
[im 54/122  soft-tissue]
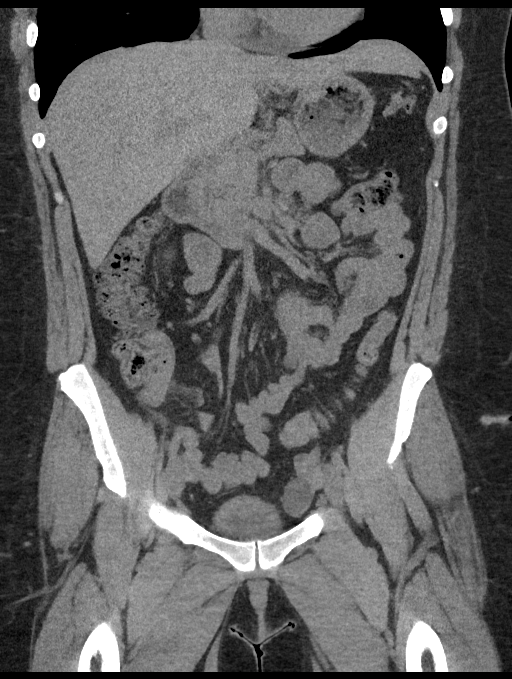
[im 68/122  soft-tissue]
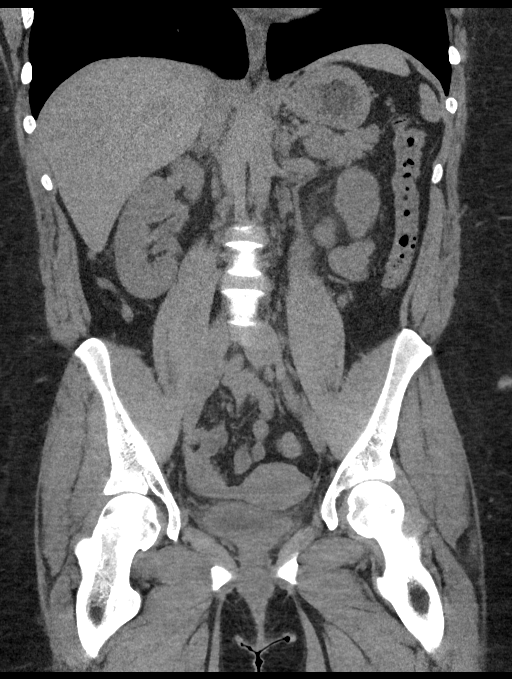

[16 of 46 positions shown; findings below may reference images not displayed]

FINDINGS: LOWER CHEST: Lung bases are clear. The visualized heart size is
normal. No pericardial effusion.

HEPATOBILIARY: Normal.

PANCREAS: Normal.

SPLEEN: Normal.

ADRENALS/URINARY TRACT: Kidneys are orthotopic, demonstrating normal
size and morphology. No nephrolithiasis; limited assessment for
renal masses on this nonenhanced examination. Mild LEFT
hydroureteronephrosis with periureteral fat stranding. Urinary
bladder is partially distended with disproportionate wall thickening
and periapical vesicular fat stranding. Normal adrenal glands.

STOMACH/BOWEL: The stomach, small and large bowel are normal in
course and caliber without inflammatory changes, sensitivity
decreased by lack of enteric contrast. Normal appendix.

VASCULAR/LYMPHATIC: Aortoiliac vessels are normal in course and
caliber. No lymphadenopathy by CT size criteria.

REPRODUCTIVE: Tubular fluid-filled structure RIGHT pelvis measuring
to 2.4 cm. Small benign-appearing LEFT adnexal cyst.

OTHER: No intraperitoneal free fluid or free air.

MUSCULOSKELETAL: Non-acute. Mild degenerative change of the cervical
spine including L2-3 and L3-4 small broad-based disc osteophyte
complex. Congenital canal narrowing. Mild levoscoliosis may be
positional. Small fat containing umbilical hernia.
IMPRESSION: 1. Mild LEFT hydroureteronephrosis with inflammation concerning for
urinary tract infection/pyelonephritis. Suspected cystitis. No
nephrolithiasis.
2. RIGHT hydrosalpinx, less likely focal fluid-filled small bowel.

## 2020-04-15 DIAGNOSIS — R52 Pain, unspecified: Secondary | ICD-10-CM | POA: Diagnosis not present

## 2020-04-15 DIAGNOSIS — R634 Abnormal weight loss: Secondary | ICD-10-CM | POA: Diagnosis not present

## 2020-04-15 DIAGNOSIS — F418 Other specified anxiety disorders: Secondary | ICD-10-CM | POA: Diagnosis not present

## 2020-04-15 DIAGNOSIS — R45851 Suicidal ideations: Secondary | ICD-10-CM | POA: Diagnosis not present

## 2020-04-15 DIAGNOSIS — G2589 Other specified extrapyramidal and movement disorders: Secondary | ICD-10-CM | POA: Diagnosis not present

## 2020-04-15 DIAGNOSIS — Z8659 Personal history of other mental and behavioral disorders: Secondary | ICD-10-CM | POA: Diagnosis not present

## 2020-04-15 DIAGNOSIS — F1190 Opioid use, unspecified, uncomplicated: Secondary | ICD-10-CM | POA: Diagnosis not present

## 2020-04-15 DIAGNOSIS — A599 Trichomoniasis, unspecified: Secondary | ICD-10-CM | POA: Diagnosis not present

## 2020-04-15 DIAGNOSIS — T40412A Poisoning by fentanyl or fentanyl analogs, intentional self-harm, initial encounter: Secondary | ICD-10-CM | POA: Diagnosis not present

## 2020-04-15 DIAGNOSIS — Z7289 Other problems related to lifestyle: Secondary | ICD-10-CM | POA: Diagnosis not present

## 2020-04-15 DIAGNOSIS — N760 Acute vaginitis: Secondary | ICD-10-CM | POA: Diagnosis not present

## 2020-04-15 DIAGNOSIS — F10239 Alcohol dependence with withdrawal, unspecified: Secondary | ICD-10-CM | POA: Diagnosis not present

## 2020-04-15 DIAGNOSIS — G479 Sleep disorder, unspecified: Secondary | ICD-10-CM | POA: Diagnosis not present

## 2020-04-15 DIAGNOSIS — E871 Hypo-osmolality and hyponatremia: Secondary | ICD-10-CM | POA: Diagnosis not present

## 2020-04-15 DIAGNOSIS — F1123 Opioid dependence with withdrawal: Secondary | ICD-10-CM | POA: Diagnosis not present

## 2020-04-15 DIAGNOSIS — G4700 Insomnia, unspecified: Secondary | ICD-10-CM | POA: Diagnosis not present

## 2020-04-15 DIAGNOSIS — F17210 Nicotine dependence, cigarettes, uncomplicated: Secondary | ICD-10-CM | POA: Diagnosis not present

## 2020-04-15 DIAGNOSIS — F419 Anxiety disorder, unspecified: Secondary | ICD-10-CM | POA: Diagnosis not present

## 2020-04-15 DIAGNOSIS — T40411A Poisoning by fentanyl or fentanyl analogs, accidental (unintentional), initial encounter: Secondary | ICD-10-CM | POA: Diagnosis not present

## 2020-04-15 DIAGNOSIS — F4310 Post-traumatic stress disorder, unspecified: Secondary | ICD-10-CM | POA: Diagnosis not present

## 2020-04-15 DIAGNOSIS — F32A Depression, unspecified: Secondary | ICD-10-CM | POA: Diagnosis not present

## 2020-04-15 DIAGNOSIS — T50904A Poisoning by unspecified drugs, medicaments and biological substances, undetermined, initial encounter: Secondary | ICD-10-CM | POA: Diagnosis not present

## 2020-04-15 DIAGNOSIS — Z6837 Body mass index (BMI) 37.0-37.9, adult: Secondary | ICD-10-CM | POA: Diagnosis not present

## 2020-04-15 DIAGNOSIS — T50901A Poisoning by unspecified drugs, medicaments and biological substances, accidental (unintentional), initial encounter: Secondary | ICD-10-CM | POA: Diagnosis not present

## 2020-04-15 DIAGNOSIS — Z79899 Other long term (current) drug therapy: Secondary | ICD-10-CM | POA: Diagnosis not present

## 2020-04-15 DIAGNOSIS — Z20822 Contact with and (suspected) exposure to covid-19: Secondary | ICD-10-CM | POA: Diagnosis not present

## 2020-04-15 DIAGNOSIS — F1994 Other psychoactive substance use, unspecified with psychoactive substance-induced mood disorder: Secondary | ICD-10-CM | POA: Diagnosis not present

## 2020-05-09 ENCOUNTER — Emergency Department: Payer: BC Managed Care – PPO

## 2020-05-09 ENCOUNTER — Encounter: Payer: Self-pay | Admitting: Emergency Medicine

## 2020-05-09 ENCOUNTER — Other Ambulatory Visit: Payer: Self-pay

## 2020-05-09 ENCOUNTER — Emergency Department
Admission: EM | Admit: 2020-05-09 | Discharge: 2020-05-09 | Disposition: A | Discharge status: Home / Self Care | Payer: BC Managed Care – PPO | Attending: Emergency Medicine | Admitting: Emergency Medicine

## 2020-05-09 DIAGNOSIS — R102 Pelvic and perineal pain: Secondary | ICD-10-CM | POA: Insufficient documentation

## 2020-05-09 DIAGNOSIS — N939 Abnormal uterine and vaginal bleeding, unspecified: Secondary | ICD-10-CM | POA: Diagnosis not present

## 2020-05-09 DIAGNOSIS — Z87891 Personal history of nicotine dependence: Secondary | ICD-10-CM | POA: Diagnosis not present

## 2020-05-09 DIAGNOSIS — N7093 Salpingitis and oophoritis, unspecified: Secondary | ICD-10-CM

## 2020-05-09 DIAGNOSIS — N7091 Salpingitis, unspecified: Secondary | ICD-10-CM | POA: Diagnosis not present

## 2020-05-09 LAB — BASIC METABOLIC PANEL
Anion gap: 11 (ref 5–15)
BUN: 12 mg/dL (ref 6–20)
CO2: 23 mmol/L (ref 22–32)
Calcium: 9 mg/dL (ref 8.9–10.3)
Chloride: 102 mmol/L (ref 98–111)
Creatinine, Ser: 0.79 mg/dL (ref 0.44–1.00)
GFR, Estimated: >60 mL/min (ref >60)
Glucose, Bld: 162 mg/dL — ABNORMAL HIGH (ref 70–99)
Potassium: 3.5 mmol/L (ref 3.5–5.1)
Sodium: 136 mmol/L (ref 135–145)

## 2020-05-09 LAB — CBC WITH DIFFERENTIAL/PLATELET
Abs Immature Granulocytes: 0.07 10*3/uL (ref 0.00–0.07)
Basophils Absolute: 0.1 10*3/uL (ref 0.0–0.1)
Basophils Relative: 0 %
Eosinophils Absolute: 0.1 10*3/uL (ref 0.0–0.5)
Eosinophils Relative: 0 %
HCT: 39.8 % (ref 36.0–46.0)
Hemoglobin: 13 g/dL (ref 12.0–15.0)
Immature Granulocytes: 0 %
Lymphocytes Relative: 12 %
Lymphs Abs: 2 10*3/uL (ref 0.7–4.0)
MCH: 29.5 pg (ref 26.0–34.0)
MCHC: 32.7 g/dL (ref 30.0–36.0)
MCV: 90.5 fL (ref 80.0–100.0)
Monocytes Absolute: 1.5 10*3/uL — ABNORMAL HIGH (ref 0.1–1.0)
Monocytes Relative: 9 %
Neutro Abs: 12.9 10*3/uL — ABNORMAL HIGH (ref 1.7–7.7)
Neutrophils Relative %: 79 %
Platelets: 340 10*3/uL (ref 150–400)
RBC: 4.4 MIL/uL (ref 3.87–5.11)
RDW: 12.6 % (ref 11.5–15.5)
WBC: 16.5 10*3/uL — ABNORMAL HIGH (ref 4.0–10.5)
nRBC: 0 % (ref 0.0–0.2)

## 2020-05-09 LAB — HCG, QUANTITATIVE, PREGNANCY: hCG, Beta Chain, Quant, S: 1 m[IU]/mL (ref <5)

## 2020-05-09 LAB — URINALYSIS, COMPLETE (UACMP) WITH MICROSCOPIC
Bilirubin Urine: NEGATIVE
Glucose, UA: NEGATIVE mg/dL
Ketones, ur: NEGATIVE mg/dL
Leukocytes,Ua: NEGATIVE
Nitrite: NEGATIVE
Protein, ur: NEGATIVE mg/dL
Specific Gravity, Urine: 1.011 (ref 1.005–1.030)
Squamous Epithelial / HPF: NONE SEEN (ref 0–5)
pH: 6 (ref 5.0–8.0)

## 2020-05-09 LAB — ABO/RH: ABO/RH(D): A NEG

## 2020-05-09 MED ORDER — SODIUM CHLORIDE 0.9 % IV BOLUS: 1000.0000 mL | Freq: Once | INTRAVENOUS | Status: DC

## 2020-05-09 MED ORDER — PIPERACILLIN-TAZOBACTAM 3.375 G IVPB 30 MIN
3.3750 g | Freq: Once | INTRAVENOUS | Status: DC
  Filled 2020-05-09: qty 50

## 2020-05-09 NOTE — ED Triage Notes (Signed)
Pt to ED via POV stating that she has having abdominal cramping and lower back pain. Pt states that she has went through 4 boxes of tampons in the last 3 days. Pt reports that she is passing a lot of tissue. Pt had a positive home pregnancy test 1 week ago. Pt is tearful in triage but is in NAD.

## 2020-05-09 NOTE — ED Provider Notes (Signed)
[facility] Emergency Department Provider Note  ____________________________________________  Time seen: Approximately 3:36 PM  I have reviewed the triage vital signs and the nursing notes.   HISTORY  Chief Complaint Abdominal Pain and Vaginal Bleeding    HPI Melissa Mcmillan is a 35 y.o. female who presents the emergency department complaining of vaginal bleeding and lower abdominal cramping x4 days.  Patient states that she had her menstrual cycle roughly 2 weeks ago, it was normal time and normal length.  Patient did not have any ongoing symptoms until 4 days ago she developed heavy vaginal bleeding.  Patient states that she is passing what appears to be clots versus tissue in addition to bleeding.  Unsure whether there is a chance of pregnancy.  Patient denies any fevers or chills, nausea or vomiting, diarrhea or constipation.  No urinary symptoms of dysuria, polyuria or hematuria.        History reviewed. No pertinent past medical history.  There are no problems to display for this patient.   Past Surgical History:  Procedure Laterality Date  . TUBAL LIGATION      Prior to Admission medications   Medication Sig Start Date End Date Taking? Authorizing Provider  meloxicam (MOBIC) 15 MG tablet Take 1 tablet (15 mg total) by mouth daily. 02/19/18   Richard Holz, Delorise Royals, PA-C  methocarbamol (ROBAXIN) 500 MG tablet Take 1 tablet (500 mg total) by mouth 4 (four) times daily. 02/19/18   Ytzel Gubler, Delorise Royals, PA-C    Allergies Patient has no known allergies.  No family history on file.  Social History Social History   Tobacco Use  . Smoking status: Former Games developer  . Smokeless tobacco: Never Used  Vaping Use  . Vaping Use: Never used  Substance Use Topics  . Alcohol use: Yes    Comment: social  . Drug use: No     Review of Systems  Constitutional: No fever/chills Eyes: No visual changes. No discharge ENT: No upper respiratory  complaints. Cardiovascular: no chest pain. Respiratory: no cough. No SOB. Gastrointestinal: Lower abdominal cramping.  No nausea, no vomiting.  No diarrhea.  No constipation. Genitourinary: Negative for dysuria. No hematuria.  Positive for heavy vaginal bleeding x4 days Musculoskeletal: Negative for musculoskeletal pain. Skin: Negative for rash, abrasions, lacerations, ecchymosis. Neurological: Negative for headaches, focal weakness or numbness.  10 System ROS otherwise negative.  ____________________________________________   PHYSICAL EXAM:  VITAL SIGNS: ED Triage Vitals [05/09/20 1444]  Enc Vitals Group     BP (!) 136/94     Pulse Rate 99     Resp 16     Temp 98.5 F (36.9 C)     Temp Source Oral     SpO2 97 %     Weight 250 lb (113.4 kg)     Height 6' (1.829 m)     Head Circumference      Peak Flow      Pain Score 9     Pain Loc      Pain Edu?      Excl. in GC?      Constitutional: Alert and oriented. Well appearing and in no acute distress. Eyes: Conjunctivae are normal. PERRL. EOMI. Head: Atraumatic. ENT:      Ears:       Nose: No congestion/rhinnorhea.      Mouth/Throat: Mucous membranes are moist.  Neck: No stridor.    Cardiovascular: Normal rate, regular rhythm. Normal S1 and S2.  Good peripheral circulation. Respiratory: Normal respiratory  effort without tachypnea or retractions. Lungs CTAB. Good air entry to the bases with no decreased or absent breath sounds. Gastrointestinal: Bowel sounds 4 quadrants. Soft and nontender to palpation over the 4 quadrants.  Patient is tender to palpation in the suprapubic region.  This is diffusely in the suprapubic region.  Does not appear to be extending into the abdomen itself.. No guarding or rigidity. No palpable masses. No distention. No CVA tenderness. Musculoskeletal: Full range of motion to all extremities. No gross deformities appreciated. Neurologic:  Normal speech and language. No gross focal neurologic deficits  are appreciated.  Skin:  Skin is warm, dry and intact. No rash noted. Psychiatric: Mood and affect are normal. Speech and behavior are normal. Patient exhibits appropriate insight and judgement.   ____________________________________________   LABS (all labs ordered are listed, but only abnormal results are displayed)  Labs Reviewed  CBC WITH DIFFERENTIAL/PLATELET - Abnormal; Notable for the following components:      Result Value   WBC 16.5 (*)    Neutro Abs 12.9 (*)    Monocytes Absolute 1.5 (*)    All other components within normal limits  BASIC METABOLIC PANEL - Abnormal; Notable for the following components:   Glucose, Bld 162 (*)    All other components within normal limits  URINALYSIS, COMPLETE (UACMP) WITH MICROSCOPIC - Abnormal; Notable for the following components:   Color, Urine YELLOW (*)    APPearance CLEAR (*)    Hgb urine dipstick MODERATE (*)    Bacteria, UA RARE (*)    All other components within normal limits  HCG, QUANTITATIVE, PREGNANCY  ABO/RH   ____________________________________________  EKG   ____________________________________________  RADIOLOGY I personally viewed and evaluated these images as part of my medical decision making, as well as reviewing the written report by the radiologist.  ED Provider Interpretation: Ultrasound reveals dilated tubular structure in the right adnexa with wall hyperemia, internal debris's and fluid concerning for infection/abscess  US PELVIC COMPLETE W TRANSVAGINAL AND TORSION R/O  Result Date: 05/09/2020 CLINICAL DATA:  35 year old with pelvic pain and vaginal bleeding. EXAM: TRANSABDOMINAL AND TRANSVAGINAL ULTRASOUND OF PELVIS DOPPLER ULTRASOUND OF OVARIES TECHNIQUE: Both transabdominal and transvaginal ultrasound examinations of the pelvis were performed. Transabdominal technique was performed for global imaging of the pelvis including uterus, ovaries, adnexal regions, and pelvic cul-de-sac. It was necessary to  proceed with endovaginal exam following the transabdominal exam to visualize the uterus, endometrium, and adnexa. Color and duplex Doppler ultrasound was utilized to evaluate blood flow to the ovaries. COMPARISON:  Abdominal CT 08/09/2017.  Pelvic ultrasound 02/08/2012 FINDINGS: Uterus Measurements: 8.2 x 3.9 x 3.9 cm = volume: 67 mL. The uterus is anteverted. No fibroids or other mass visualized. Endometrium Thickness: 5 mm distally, however focal area of questionable thickening versus echogenic structure in the lower uterine segment measuring 7 mm. No fluid in the endometrial canal. Right ovary Thick-walled tubular cystic structure in the right adnexa measures 9.3 x 4.3 x 4.1 cm, likely representing dilated fallopian tube. There is wall hyperemia. There is internal debris and fluid level. This appears to be adjacent to the right ovary which is difficult to accurately measure. Suggestion of 3.6 cm cyst in the right ovary with low level internal echoes. This is best appreciated on cine clips. There is some blood flow peripherally in the ovarian parenchyma. Left ovary Measurements: 3.6 x 2.4 x 2.8 cm = volume: 12 mL. No evidence of cystic or solid lesion. Normal blood flow. No extra ovarian mass. Pulsed Doppler  evaluation of the left ovary demonstrates normal low-resistance arterial and venous waveforms. Other findings Trace pelvic free fluid. IMPRESSION: 1. Dilated tubular structure in the right adnexa with wall hyperemia, internal debris and fluid level, suspicious for pyosalpinx. Hydrosalpinx was visualized on 2019 CT, however appears increased in size and now with increased surrounding vascularity suspicious for superimposed infection. 2. Right ovary is difficult to delineate due to the adnexal structure, however there may be a 3.6 cm ovarian hemorrhagic cyst. 3. Question of focal endometrial thickening of 7 mm versus hyperechoic lesion. Consider sonohysterogram for further evaluation, prior to hysteroscopy or  endometrial biopsy. Electronically Signed   By: Narda Rutherford M.D.   On: 05/09/2020 17:48    ____________________________________________    PROCEDURES  Procedure(s) performed:    Procedures    Medications  piperacillin-tazobactam (ZOSYN) IVPB 3.375 g (has no administration in time range)  sodium chloride 0.9 % bolus 1,000 mL (has no administration in time range)     ____________________________________________   INITIAL IMPRESSION / ASSESSMENT AND PLAN / ED COURSE  Pertinent labs & imaging results that were available during my care of the patient were reviewed by me and considered in my medical decision making (see chart for details).  Review of the [place] CSRS was performed in accordance of the [facility] prior to dispensing any controlled drugs.          Patient presented to emergency department with lower abdominal pain and vaginal bleeding.  Patient was tender in the suprapubic region diffusely across the abdomen.  Patient had vaginal bleeding and states that she had used 4 boxes of tampons in the last 3 days for the bleeding.  She states that she was also passing "tissue."  Patient had a history of a tubal ligation but states that she "could be pregnant."  Initial work-up revealed hCG less than 1.  Patient did have blood in her urine which was likely secondary to vaginal bleeding.  Elevated white blood cell count at 16.5.  Ultrasound revealed a tubular structure with debris and fluid concerning for infection around the right adnexa.  I discussed the patient with on-call OB/GYN, Dr. Valentino Saxon.  OB/GYN advised that if the patient could tolerate oral antibiotics she may go home on doxycycline with close OB/GYN follow-up.  Patient was to receive IV antibiotics prior to discharge, follow-up closely with OB/GYN.  Patient developed increasing pain, nausea or vomiting she should return to the emergency department at that time admission for surgery would be considered.  Prior to receiving these  recommendations, patient had eloped.  I informed the patient of the decisions, the course to include admission for surgery, IV antibiotics or possible discharge with oral antibiotics.  Patient had stated at that time that she did not want to be admitted if at all possible.  I feel that the patient may have eloped over concerns about being admitted.  We attempted to reach the patient and left a voicemail asking the patient to come back for antibiotics and her final recommendations but patient has not returned the call nor has returned to the emergency department.  Patient has not received her IV antibiotics nor has she received prescriptions for outpatient management.   ____________________________________________  FINAL CLINICAL IMPRESSION(S) / ED DIAGNOSES  Final diagnoses:  Vaginal bleeding  Pelvic pain  Right pyosalpinx      NEW MEDICATIONS STARTED DURING THIS VISIT:  ED Discharge Orders    None          This chart was dictated  using voice recognition software/Dragon. Despite best efforts to proofread, errors can occur which can change the meaning. Any change was purely unintentional.    Lanette Hampshire 05/09/20 2012    Shaune Pollack, MD 05/10/20 2039

## 2020-05-09 NOTE — ED Notes (Signed)
Provider at bedside. Pt states LNMP was 12/15 (day 1). Has been bleeding with clots and lower abdominal cramping for 1 week.

## 2020-05-09 NOTE — ED Notes (Signed)
ENTERED ROOM TO START PT PIV AND GIVE ABX AND FLUIDS AS ORDERED. PT NOT FOUND IN ROOM, NO BELONGINGS AT BEDSIDE. CALLED PT TO FIND PT OUT IF PT LEFT, NO ANSWER. LEFT VOICEMAIL. MADE PROVIDER AWARE.

## 2020-05-09 NOTE — ED Notes (Addendum)
Pt resting in bed with partner. Pt requests pain meds. Communicated to provider.

## 2020-05-10 ENCOUNTER — Encounter: Payer: Self-pay | Admitting: Emergency Medicine

## 2020-05-10 ENCOUNTER — Other Ambulatory Visit: Payer: Self-pay

## 2020-05-10 ENCOUNTER — Emergency Department
Admission: EM | Admit: 2020-05-10 | Discharge: 2020-05-10 | Disposition: A | Discharge status: Home / Self Care | Payer: BC Managed Care – PPO | Attending: Emergency Medicine | Admitting: Emergency Medicine

## 2020-05-10 DIAGNOSIS — R109 Unspecified abdominal pain: Secondary | ICD-10-CM | POA: Insufficient documentation

## 2020-05-10 DIAGNOSIS — Z5321 Procedure and treatment not carried out due to patient leaving prior to being seen by health care provider: Secondary | ICD-10-CM | POA: Diagnosis not present

## 2020-05-10 DIAGNOSIS — N939 Abnormal uterine and vaginal bleeding, unspecified: Secondary | ICD-10-CM | POA: Insufficient documentation

## 2020-05-10 NOTE — ED Notes (Signed)
Patient called for vital signs with no answer. 

## 2020-05-10 NOTE — ED Triage Notes (Signed)
Pt to ED via POV, states was seen yesterday and was supposed to be admitted for surgery for a cyst and infection on her fallopian tube. Pt states eloped from room yesterday after being seen by EDP. Pt back for continued abdominal pain and vaginal bleeding.

## 2020-05-10 NOTE — ED Notes (Signed)
Patient called for vitals with no answer 

## 2020-05-11 ENCOUNTER — Other Ambulatory Visit: Payer: Self-pay

## 2020-05-11 ENCOUNTER — Emergency Department
Admission: EM | Admit: 2020-05-11 | Discharge: 2020-05-11 | Disposition: A | Discharge status: Home / Self Care | Payer: BC Managed Care – PPO | Attending: Emergency Medicine | Admitting: Emergency Medicine

## 2020-05-11 ENCOUNTER — Encounter: Payer: Self-pay | Admitting: *Deleted

## 2020-05-11 DIAGNOSIS — N939 Abnormal uterine and vaginal bleeding, unspecified: Secondary | ICD-10-CM

## 2020-05-11 DIAGNOSIS — N83201 Unspecified ovarian cyst, right side: Secondary | ICD-10-CM

## 2020-05-11 DIAGNOSIS — Z87891 Personal history of nicotine dependence: Secondary | ICD-10-CM | POA: Diagnosis not present

## 2020-05-11 DIAGNOSIS — N7093 Salpingitis and oophoritis, unspecified: Secondary | ICD-10-CM

## 2020-05-11 DIAGNOSIS — N7091 Salpingitis, unspecified: Secondary | ICD-10-CM | POA: Insufficient documentation

## 2020-05-11 LAB — CBC WITH DIFFERENTIAL/PLATELET
Abs Immature Granulocytes: 0.06 10*3/uL (ref 0.00–0.07)
Basophils Absolute: 0.1 10*3/uL (ref 0.0–0.1)
Basophils Relative: 1 %
Eosinophils Absolute: 0.1 10*3/uL (ref 0.0–0.5)
Eosinophils Relative: 1 %
HCT: 37.6 % (ref 36.0–46.0)
Hemoglobin: 12.6 g/dL (ref 12.0–15.0)
Immature Granulocytes: 1 %
Lymphocytes Relative: 19 %
Lymphs Abs: 2.3 10*3/uL (ref 0.7–4.0)
MCH: 29.9 pg (ref 26.0–34.0)
MCHC: 33.5 g/dL (ref 30.0–36.0)
MCV: 89.3 fL (ref 80.0–100.0)
Monocytes Absolute: 1.4 10*3/uL — ABNORMAL HIGH (ref 0.1–1.0)
Monocytes Relative: 11 %
Neutro Abs: 8.3 10*3/uL — ABNORMAL HIGH (ref 1.7–7.7)
Neutrophils Relative %: 67 %
Platelets: 365 10*3/uL (ref 150–400)
RBC: 4.21 MIL/uL (ref 3.87–5.11)
RDW: 12.7 % (ref 11.5–15.5)
WBC: 12.2 10*3/uL — ABNORMAL HIGH (ref 4.0–10.5)
nRBC: 0 % (ref 0.0–0.2)

## 2020-05-11 LAB — BASIC METABOLIC PANEL
Anion gap: 10 (ref 5–15)
BUN: 9 mg/dL (ref 6–20)
CO2: 25 mmol/L (ref 22–32)
Calcium: 8.9 mg/dL (ref 8.9–10.3)
Chloride: 103 mmol/L (ref 98–111)
Creatinine, Ser: 0.71 mg/dL (ref 0.44–1.00)
GFR, Estimated: >60 mL/min (ref >60)
Glucose, Bld: 107 mg/dL — ABNORMAL HIGH (ref 70–99)
Potassium: 3.8 mmol/L (ref 3.5–5.1)
Sodium: 138 mmol/L (ref 135–145)

## 2020-05-11 MED ORDER — PIPERACILLIN-TAZOBACTAM 3.375 G IVPB 30 MIN
3.3750 g | Freq: Once | INTRAVENOUS | Status: AC
  Administered 2020-05-11: 3.375 g via INTRAVENOUS
  Filled 2020-05-11: qty 50

## 2020-05-11 MED ORDER — MORPHINE SULFATE (PF) 4 MG/ML IV SOLN
4.0000 mg | Freq: Once | INTRAVENOUS | Status: AC
  Administered 2020-05-11: 4 mg via INTRAVENOUS
  Filled 2020-05-11: qty 1

## 2020-05-11 MED ORDER — ONDANSETRON HCL 4 MG/2ML IJ SOLN
4.0000 mg | Freq: Once | INTRAMUSCULAR | Status: AC
  Administered 2020-05-11: 4 mg via INTRAVENOUS
  Filled 2020-05-11: qty 2

## 2020-05-11 MED ORDER — DOXYCYCLINE HYCLATE 100 MG PO TABS: 100.0000 mg | ORAL_TABLET | Freq: Two times a day (BID) | ORAL | 0 refills | Status: AC

## 2020-05-11 MED ORDER — KETOROLAC TROMETHAMINE 30 MG/ML IJ SOLN
15.0000 mg | Freq: Once | INTRAMUSCULAR | Status: AC
  Administered 2020-05-11: 15 mg via INTRAVENOUS
  Filled 2020-05-11: qty 1

## 2020-05-11 NOTE — Discharge Instructions (Signed)
°  You are being discharged with a prescription for doxycycline antibiotics to take twice daily for the next 10 days.    Please follow-up with the OB/GYN doctors in the clinic in the next 1 week, I have attached the phone number to do so.  There expecting to see you at the end of this week or early next week.  Please take Tylenol and ibuprofen/Advil for your pain.  It is safe to take them together, or to alternate them every few hours.  Take up to 1000mg  of Tylenol at a time, up to 4 times per day.  Do not take more than 4000 mg of Tylenol in 24 hours.  For ibuprofen, take 400-600 mg, 4-5 times per day.  If you develop any significantly worsening pain or bleeding despite the above medications, fevers, or inability to keep any food/drink down, please return to the ED.

## 2020-05-11 NOTE — ED Triage Notes (Signed)
Pt states that on Saturday she became scared and left but she does want the surgery to help her feel better now.

## 2020-05-11 NOTE — ED Triage Notes (Signed)
Pt was seen Saturday due to vaginal bleeding and was told she should be admitted and have this cyst surgically removed.  Pt left and came back yesterday but left due to long wait and bleeding through tampons.  Pt is tearful, states that she is anxious and has continued to bleed.

## 2020-05-11 NOTE — ED Provider Notes (Signed)
[facility] Emergency Department Provider Note ____________________________________________   Event Date/Time   First MD Initiated Contact with Patient 05/11/20 1410     (approximate)  I have reviewed the triage vital signs and the nursing notes.  HISTORY  Chief Complaint Vaginal Bleeding   HPI Melissa Mcmillan is a 35 y.o. femalewho presents to the ED for evaluation of vaginal bleeding.   Chart review indicates patient was seen in our ED 2 days ago for similar complaints of heavy vaginal bleeding. Not pregnant. Pelvic ultrasound with concern for pyosalpinx and hemorrhagic ovarian cyst. Case was discussed with OB/GYN for possible admission, prior to receiving IV antibiotics in the ED and OB eval, patient eloped from the department.  Patient again presents to the ED with continued vaginal bleeding and RLQ abdominal pain.  She is very apologetic about eloping 2 days ago when she was here, and reports significant anxiety with the possibility of surgery, hospitalization, the possibility of inability to bear children later in life.  She reports continued vaginal bleeding, going through about 6 tampons per day, continued RLQ abdominal pain that is burning and aching in nature.  She denies any fevers, emesis, stool changes, chest pain, syncope.  History reviewed. No pertinent past medical history.  There are no problems to display for this patient.   Past Surgical History:  Procedure Laterality Date  . TUBAL LIGATION      Prior to Admission medications   Medication Sig Start Date End Date Taking? Authorizing Provider  doxycycline (VIBRA-TABS) 100 MG tablet Take 1 tablet (100 mg total) by mouth 2 (two) times daily for 10 days. 05/11/20 05/21/20 Yes Delton Prairie, MD  meloxicam (MOBIC) 15 MG tablet Take 1 tablet (15 mg total) by mouth daily. 02/19/18   Cuthriell, Delorise Royals, PA-C  methocarbamol (ROBAXIN) 500 MG tablet Take 1 tablet (500 mg total) by mouth 4 (four)  times daily. 02/19/18   Cuthriell, Delorise Royals, PA-C    Allergies Patient has no known allergies.  No family history on file.  Social History Social History   Tobacco Use  . Smoking status: Former Games developer  . Smokeless tobacco: Never Used  Vaping Use  . Vaping Use: Never used  Substance Use Topics  . Alcohol use: Yes    Comment: social  . Drug use: No    Review of Systems  Constitutional: No fever/chills Eyes: No visual changes. ENT: No sore throat. Cardiovascular: Denies chest pain. Respiratory: Denies shortness of breath. Gastrointestinal:  No nausea, no vomiting.  No diarrhea.  No constipation.  Positive for RLQ pain and vaginal bleeding. Genitourinary: Negative for dysuria.  Positive for vaginal bleeding. Musculoskeletal: Negative for back pain. Skin: Negative for rash. Neurological: Negative for headaches, focal weakness or numbness.  ____________________________________________   PHYSICAL EXAM:  VITAL SIGNS: Vitals:   05/11/20 1225 05/11/20 1414  BP: (!) 128/106 124/81  Pulse: 91 85  Resp: 16 16  Temp: 98.9 F (37.2 C) 99.5 F (37.5 C)  SpO2: 98% 100%      Constitutional: Alert and oriented. Well appearing and in no acute distress. Eyes: Conjunctivae are normal. PERRL. EOMI. Head: Atraumatic. Nose: No congestion/rhinnorhea. Mouth/Throat: Mucous membranes are moist.  Oropharynx non-erythematous. Neck: No stridor. No cervical spine tenderness to palpation. Cardiovascular: Normal rate, regular rhythm. Grossly normal heart sounds.  Good peripheral circulation. Respiratory: Normal respiratory effort.  No retractions. Lungs CTAB. Gastrointestinal: Soft , nondistended. No CVA tenderness. RLQ and suprapubic TTP without peritoneal features, otherwise benign abdomen.  Musculoskeletal:  No lower extremity tenderness nor edema.  No joint effusions. No signs of acute trauma. Neurologic:  Normal speech and language. No gross focal neurologic deficits are  appreciated. No gait instability noted. Skin:  Skin is warm, dry and intact. No rash noted. Psychiatric: Mood and affect are normal. Speech and behavior are normal.  ____________________________________________   LABS (all labs ordered are listed, but only abnormal results are displayed)  Labs Reviewed  CBC WITH DIFFERENTIAL/PLATELET - Abnormal; Notable for the following components:      Result Value   WBC 12.2 (*)    Neutro Abs 8.3 (*)    Monocytes Absolute 1.4 (*)    All other components within normal limits  BASIC METABOLIC PANEL - Abnormal; Notable for the following components:   Glucose, Bld 107 (*)    All other components within normal limits  POC URINE PREG, ED   ____________________________________________  RADIOLOGY  ED MD interpretation: Pelvic ultrasound from 2 days ago reviewed by me with right-sided pyosalpinx and ovarian cyst  Official radiology report(s): No results found. ____________________________________________   PROCEDURES and INTERVENTIONS  Procedure(s) performed (including Critical Care):  .1-3 Lead EKG Interpretation Performed by: Delton Prairie, MD Authorized by: Delton Prairie, MD     Interpretation: normal     ECG rate:  82   ECG rate assessment: normal     Rhythm: sinus rhythm     Ectopy: none     Conduction: normal      Medications  ondansetron (ZOFRAN) injection 4 mg (4 mg Intravenous Given 05/11/20 1546)  morphine 4 MG/ML injection 4 mg (4 mg Intravenous Given 05/11/20 1546)  piperacillin-tazobactam (ZOSYN) IVPB 3.375 g (0 g Intravenous Stopped 05/11/20 1637)  ketorolac (TORADOL) 30 MG/ML injection 15 mg (15 mg Intravenous Given 05/11/20 1640)    ____________________________________________   MDM / ED COURSE   35 year old G0 presents to the ED with continued RLQ abdominal pain and vaginal bleeding, most likely due to a hemorrhagic ovarian cyst with associated pyosalpinx, ultimately amenable to outpatient management.  Normal vitals  on room air without fever.  Exam with tenderness to the RLQ without peritoneal features.  She otherwise looks well without neurovascular deficits, CVA tenderness or additional acute pathology.  No clinical signs of ovarian torsion, sepsis.  Blood work with leukocytosis, improving from yesterday.  I discussed the case with OB/GYN on-call, as below, who recommends trial of outpatient management due to improving white count and no evidence of surgical pathology.  We will provide IV Zosyn and discharged with a course of doxycycline.  We discussed return precautions for the ED and close follow-up with OB/GYN.  Patient medically stable for outpatient management.   Clinical Course as of 05/11/20 1712  Mon May 11, 2020  1431 Paged Haroldine Laws, CNM on call for [facility], the on call service. They request I speak with [facility] due to previous conversation with [facility] [DS]  1435 Called michelle lawhorn, cnm on call for [facility], asked I call dr Logan Bores.  I call dr Logan Bores, leave voicemail.  [DS]  1508 Callback from Dr. Logan Bores, we discuss patient presentation and workup from 2 days ago compared to today. He recommends Iv zosyn, symptom contyrol, and trial of outpatient management if controlled pain.  [DS]  1557 Reassessed.  Educated patient on my conversation with Dr. Logan Bores and our recommendation for possible trial of outpatient management.  Answered questions. [DS]  1652 Reassessed.  Patient reports improving symptoms.  We again discussed her outpatient management and following  up with OB/GYN.  Answered questions. [DS]    Clinical Course User Index [DS] Delton Prairie, MD    ____________________________________________   FINAL CLINICAL IMPRESSION(S) / ED DIAGNOSES  Final diagnoses:  Vaginal bleeding  Pyosalpinx  Cyst of right ovary     ED Discharge Orders         Ordered    doxycycline (VIBRA-TABS) 100 MG tablet  2 times daily        05/11/20 1649           Gabriele Zwilling Katrinka Blazing   Note:   This document was prepared using Dragon voice recognition software and may include unintentional dictation errors.   Delton Prairie, MD 05/11/20 [phone]

## 2020-05-11 NOTE — ED Notes (Signed)
Iv started  meds given  md at bedside with pt.

## 2020-05-14 ENCOUNTER — Other Ambulatory Visit: Payer: Self-pay

## 2020-05-14 ENCOUNTER — Ambulatory Visit (INDEPENDENT_AMBULATORY_CARE_PROVIDER_SITE_OTHER): Payer: BC Managed Care – PPO | Admitting: Obstetrics and Gynecology

## 2020-05-14 ENCOUNTER — Encounter: Payer: Self-pay | Admitting: Obstetrics and Gynecology

## 2020-05-14 VITALS — BP 146/90 | HR 92 | Resp 16 | Wt 289.9 lb

## 2020-05-14 DIAGNOSIS — R102 Pelvic and perineal pain: Secondary | ICD-10-CM | POA: Diagnosis not present

## 2020-05-14 DIAGNOSIS — N83201 Unspecified ovarian cyst, right side: Secondary | ICD-10-CM | POA: Diagnosis not present

## 2020-05-14 MED ORDER — OXYCODONE-ACETAMINOPHEN 5-325 MG PO TABS: 1.0000 | ORAL_TABLET | Freq: Four times a day (QID) | ORAL | 0 refills | Status: DC | PRN

## 2020-05-14 NOTE — Progress Notes (Signed)
HPI:      [name] is a 35 y.o. No obstetric history on file. who LMP was No LMP recorded.  Subjective:   She presents today after being seen in the ED for pelvic pain.  She was found to have a hemorrhagic right ovarian cyst.  She also appeared to have a dilated right fallopian tube.  This was previously noted approximately 2 years ago but it has slightly increased in diameter since then.  Of significant note, patient has also had surgery on her left fallopian tube for apparent hydrosalpinx based on verbal history.  Patient does state that she had gonorrhea without knowing it in the past. She is currently not using anything for birth control and having unprotected intercourse without resultant pregnancy.  She states that she has monthly menstrual periods. Patient states that her pain is intermittent and about the same as when she presented to the emergency department. She continues to take her course of doxycycline as prescribed.    Hx: The following portions of the patient's history were reviewed and updated as appropriate:             She  has a past medical history of Cyst of fallopian tube, Depression, and Ovarian cyst. She does not have a problem list on file. She  has a past surgical history that includes Tubal ligation and OTHER SURGICAL HISTORY. Her family history is not on file. She  reports that she has quit smoking. She has never used smokeless tobacco. She reports current alcohol use. She reports that she does not use drugs. She has a current medication list which includes the following prescription(s): doxycycline, meloxicam, methocarbamol, oxycodone-acetaminophen, and sertraline. She has No Known Allergies.       Review of Systems:  Review of Systems  Constitutional: Denied constitutional symptoms, night sweats, recent illness, fatigue, fever, insomnia and weight loss.  Eyes: Denied eye symptoms, eye pain, photophobia, vision change and visual disturbance.   Ears/Nose/Throat/Neck: Denied ear, nose, throat or neck symptoms, hearing loss, nasal discharge, sinus congestion and sore throat.  Cardiovascular: Denied cardiovascular symptoms, arrhythmia, chest pain/pressure, edema, exercise intolerance, orthopnea and palpitations.  Respiratory: Denied pulmonary symptoms, asthma, pleuritic pain, productive sputum, cough, dyspnea and wheezing.  Gastrointestinal: Denied, gastro-esophageal reflux, melena, nausea and vomiting.  Genitourinary: See HPI for additional information.  Musculoskeletal: Denied musculoskeletal symptoms, stiffness, swelling, muscle weakness and myalgia.  Dermatologic: Denied dermatology symptoms, rash and scar.  Neurologic: Denied neurology symptoms, dizziness, headache, neck pain and syncope.  Psychiatric: Denied psychiatric symptoms, anxiety and depression.  Endocrine: Denied endocrine symptoms including hot flashes and night sweats.   Meds:   Current Outpatient Medications on File Prior to Visit  Medication Sig Dispense Refill  . doxycycline (VIBRA-TABS) 100 MG tablet Take 1 tablet (100 mg total) by mouth 2 (two) times daily for 10 days. 20 tablet 0  . meloxicam (MOBIC) 15 MG tablet Take 1 tablet (15 mg total) by mouth daily. 30 tablet 0  . methocarbamol (ROBAXIN) 500 MG tablet Take 1 tablet (500 mg total) by mouth 4 (four) times daily. 16 tablet 0  . sertraline (ZOLOFT) 100 MG tablet Take 100 mg by mouth daily.     No current facility-administered medications on file prior to visit.          Objective:     Vitals:   05/14/20 1007  BP: (!) 146/90  Pulse: 92  Resp: 16   Filed Weights   05/14/20 1007  Weight: 289 lb 14.4  oz (131.5 kg)              Imaging results reviewed in detail with the patient.  Assessment:    No obstetric history on file. There are no problems to display for this patient.    1. Cyst of right ovary   2. Pelvic pain in female     I think it likely that her pain is secondary to the  hemorrhagic ovarian cyst rather than the dilated right fallopian tube.  Right fallopian tube has previously been imaged as dilated and may be slightly increased in size but not likely significant. Patient with a remote history of gonorrhea and surgery on her left fallopian tube.  Possibly bilateral blocked tubes.  Which may explain why she has not become pregnant.   Plan:            1.  Percocet for pain management  2.  Patient to continue antibiotics  3.  Ultrasound to further delineate hemorrhagic cyst and note increase decrease or stable size as well as further evaluate right fallopian tube.  Orders Orders Placed This Encounter  Procedures  . US PELVIS (TRANSABDOMINAL ONLY)  . US PELVIS TRANSVAGINAL NON-OB (TV ONLY)     Meds ordered this encounter  Medications  . oxyCODONE-acetaminophen (PERCOCET/ROXICET) 5-325 MG tablet    Sig: Take 1-2 tablets by mouth every 6 (six) hours as needed.    Dispense:  20 tablet    Refill:  0      F/U  Return for We will contact her with any abnormal test results. I spent 33 minutes involved in the care of this patient preparing to see the patient by obtaining and reviewing her medical history (including labs, imaging tests and prior procedures), documenting clinical information in the electronic health record (EHR), counseling and coordinating care plans, writing and sending prescriptions, ordering tests or procedures and directly communicating with the patient by discussing pertinent items from her history and physical exam as well as detailing my assessment and plan as noted above so that she has an informed understanding.  All of her questions were answered.  Elonda Husky, M.D. 05/14/2020 10:37 AM

## 2020-05-21 ENCOUNTER — Other Ambulatory Visit: Payer: BC Managed Care – PPO

## 2020-06-09 ENCOUNTER — Other Ambulatory Visit: Payer: Self-pay

## 2020-06-09 DIAGNOSIS — R102 Pelvic and perineal pain: Secondary | ICD-10-CM

## 2020-06-09 DIAGNOSIS — N83201 Unspecified ovarian cyst, right side: Secondary | ICD-10-CM

## 2020-08-12 DIAGNOSIS — Z03818 Encounter for observation for suspected exposure to other biological agents ruled out: Secondary | ICD-10-CM | POA: Diagnosis not present

## 2020-08-12 DIAGNOSIS — Z20822 Contact with and (suspected) exposure to covid-19: Secondary | ICD-10-CM | POA: Diagnosis not present

## 2020-10-18 IMAGING — CR DG LUMBAR SPINE COMPLETE 4+V
1 series · 5 of 5 positions shown · non-contrast
Comparison: CT 08/09/2017

CLINICAL DATA: Fall with low back pain

EXAM:
LUMBAR SPINE - COMPLETE 4+ VIEW

[Series 1: dg lumbar spine complete 4 +v · 0.14mm/px · 5 of 5 slices shown]
[im 1/5]
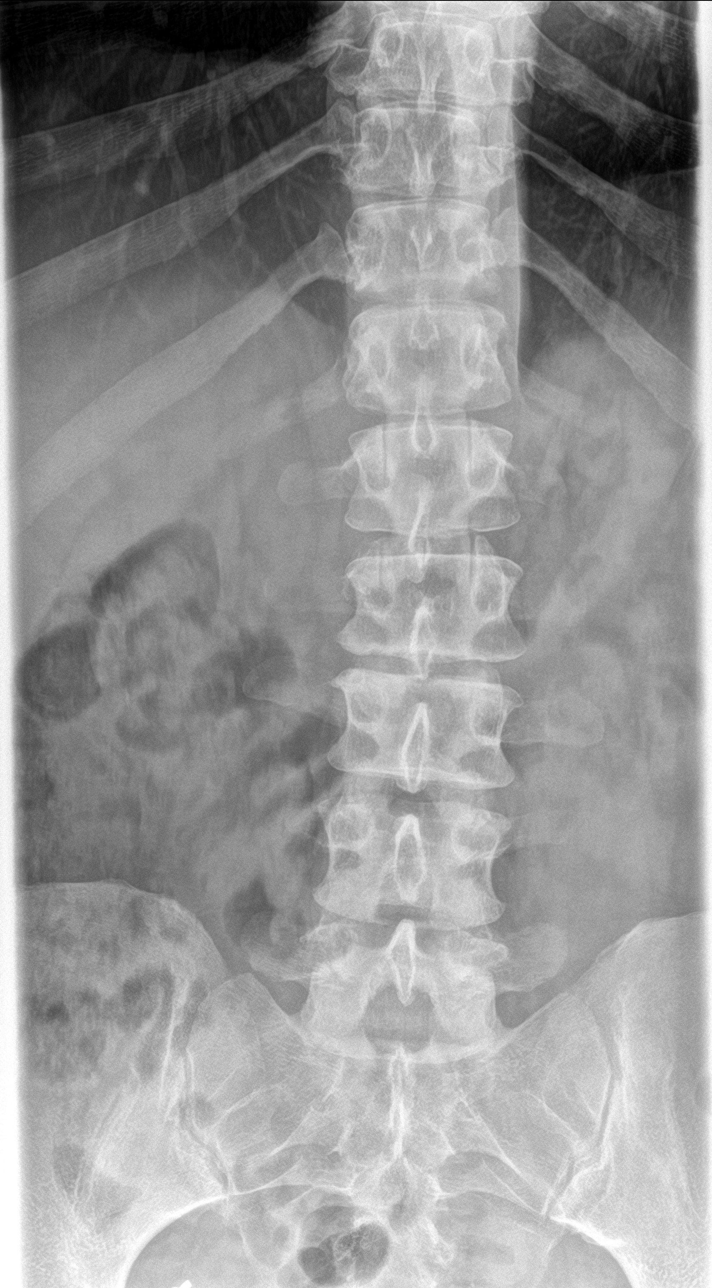
[im 2/5]
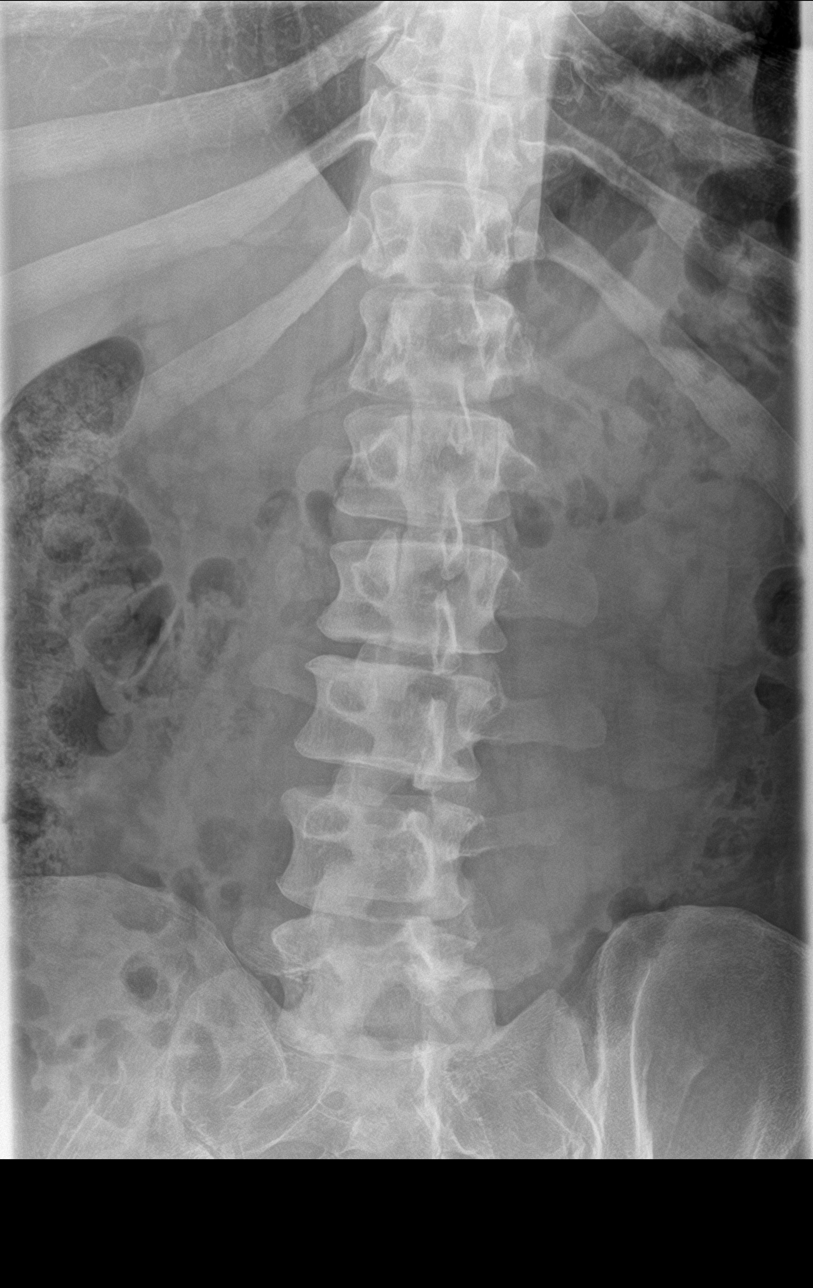
[im 3/5]
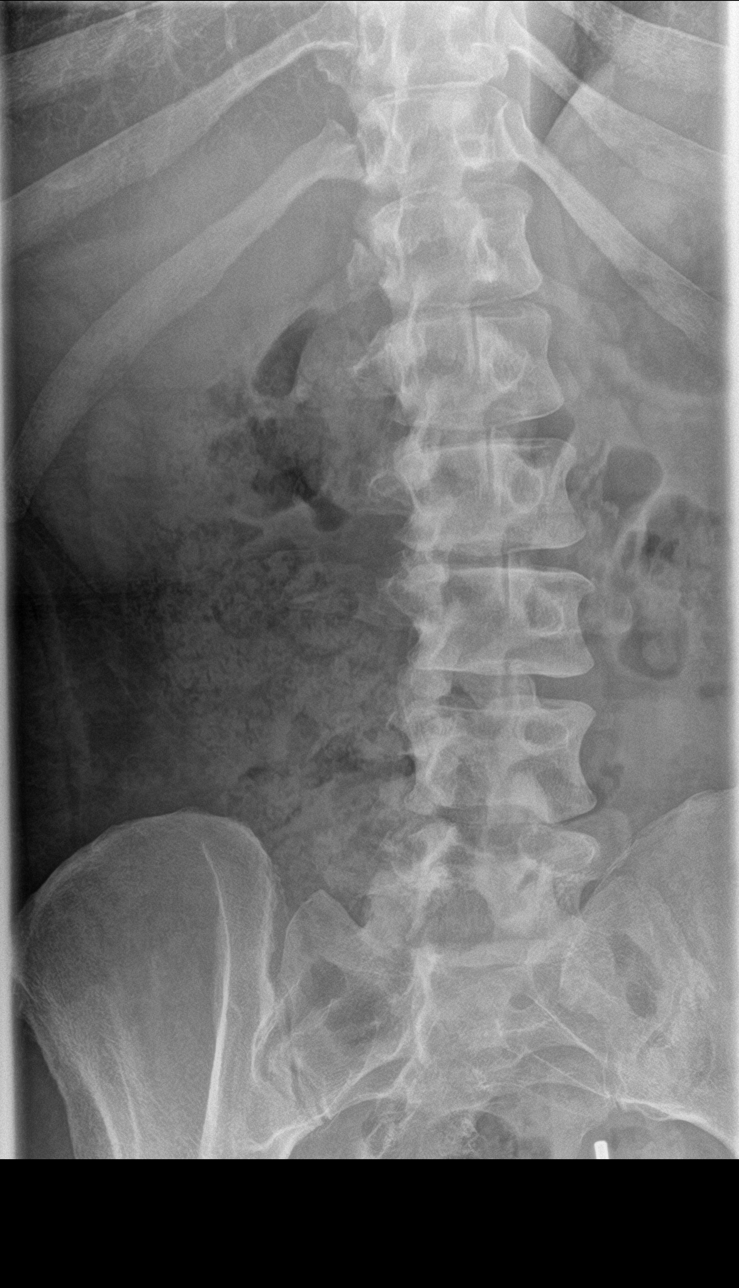
[im 4/5]
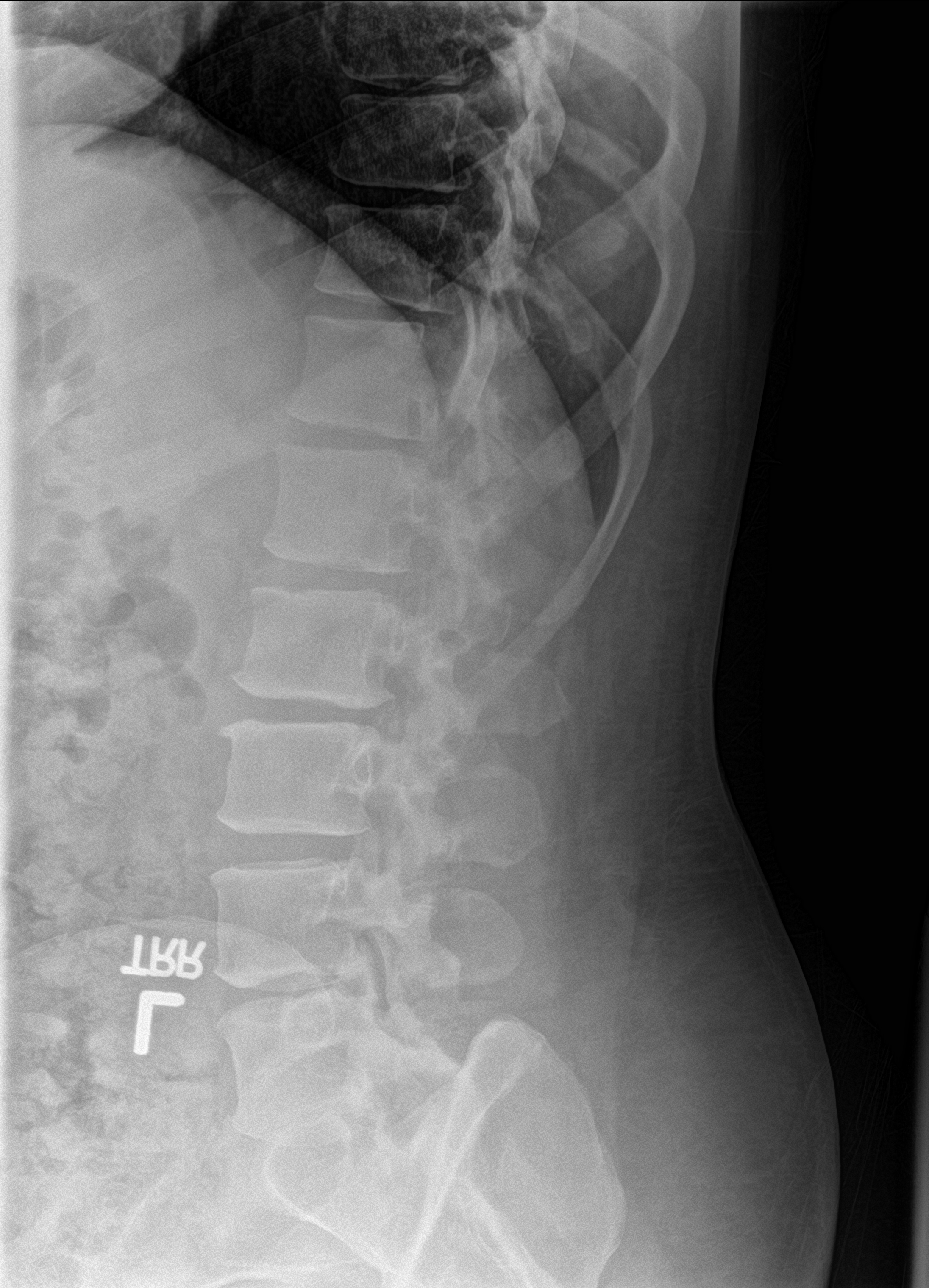
[im 5/5]
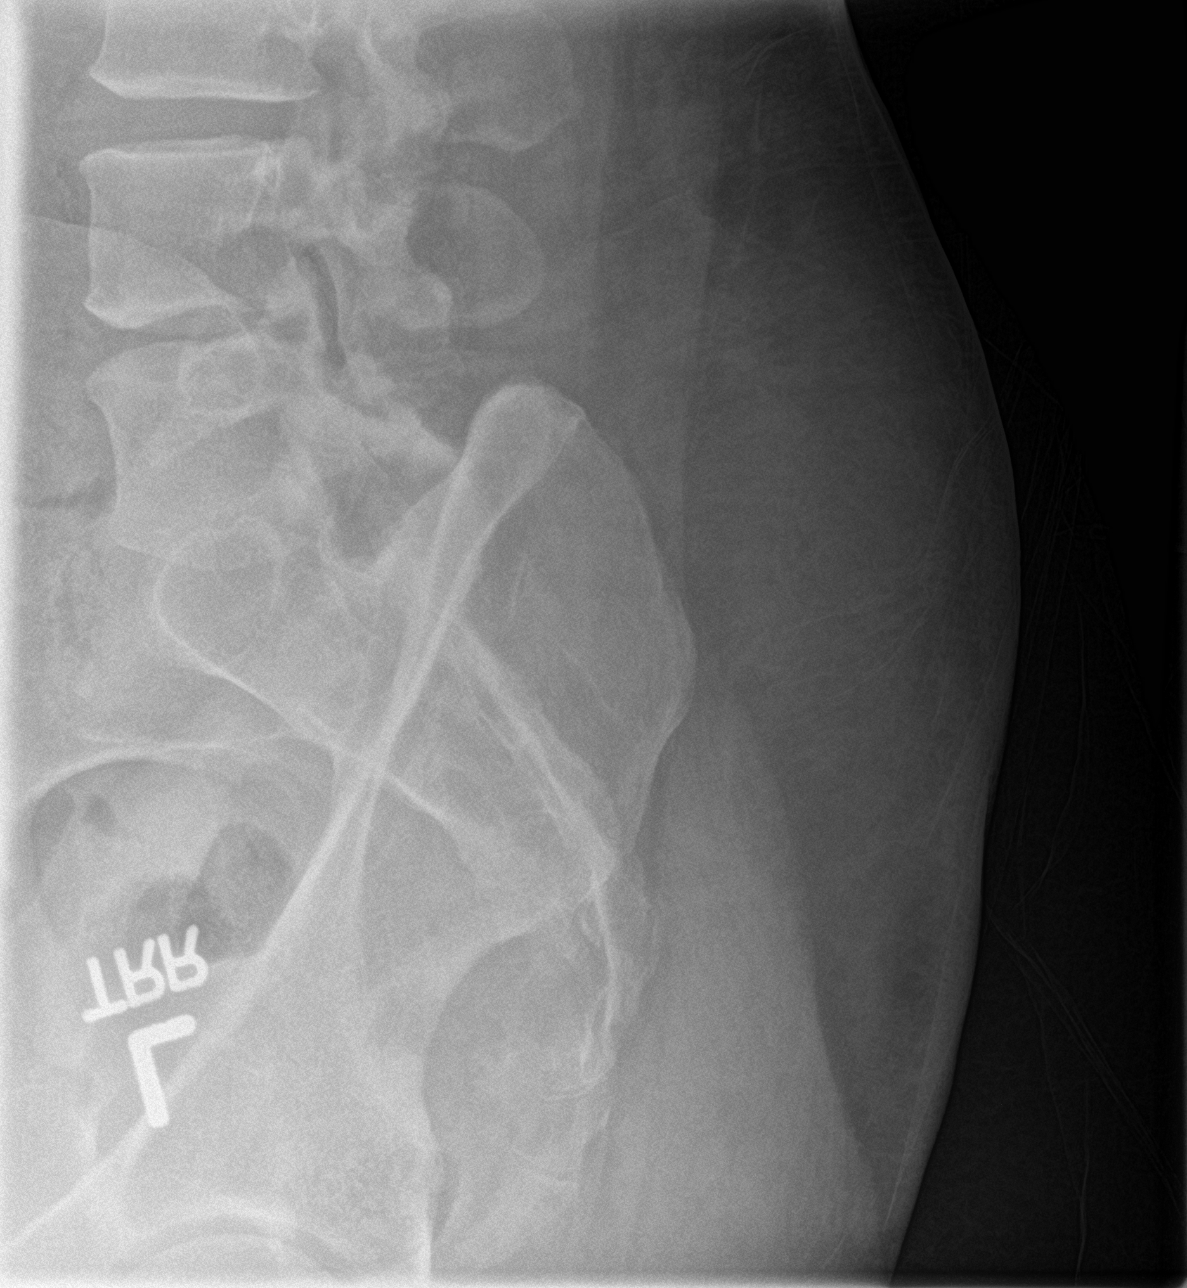

[5 of 5 positions shown; findings below may reference images not displayed]

FINDINGS: There is no evidence of lumbar spine fracture. Alignment is normal.
Mild degenerative change L2-L3.
IMPRESSION: No acute osseous abnormality.

## 2022-02-04 ENCOUNTER — Emergency Department
Admission: EM | Admit: 2022-02-04 | Discharge: 2022-02-04 | Disposition: A | Discharge status: Home / Self Care | Payer: Medicaid Other | Attending: Student in an Organized Health Care Education/Training Program | Admitting: Student in an Organized Health Care Education/Training Program

## 2022-02-04 ENCOUNTER — Emergency Department: Payer: Medicaid Other

## 2022-02-04 ENCOUNTER — Other Ambulatory Visit: Payer: Self-pay

## 2022-02-04 ENCOUNTER — Encounter: Payer: Self-pay | Admitting: Emergency Medicine

## 2022-02-04 DIAGNOSIS — R102 Pelvic and perineal pain: Secondary | ICD-10-CM

## 2022-02-04 DIAGNOSIS — N83202 Unspecified ovarian cyst, left side: Secondary | ICD-10-CM | POA: Insufficient documentation

## 2022-02-04 LAB — URINALYSIS, ROUTINE W REFLEX MICROSCOPIC
Bilirubin Urine: NEGATIVE
Glucose, UA: NEGATIVE mg/dL
Ketones, ur: 5 mg/dL — AB
Nitrite: NEGATIVE
Protein, ur: 100 mg/dL — AB
Specific Gravity, Urine: 1.024 (ref 1.005–1.030)
pH: 6 (ref 5.0–8.0)

## 2022-02-04 LAB — CBC WITH DIFFERENTIAL/PLATELET
Abs Immature Granulocytes: 0.09 10*3/uL — ABNORMAL HIGH (ref 0.00–0.07)
Basophils Absolute: 0.1 10*3/uL (ref 0.0–0.1)
Basophils Relative: 1 %
Eosinophils Absolute: 0 10*3/uL (ref 0.0–0.5)
Eosinophils Relative: 0 %
HCT: 38.6 % (ref 36.0–46.0)
Hemoglobin: 12.7 g/dL (ref 12.0–15.0)
Immature Granulocytes: 1 %
Lymphocytes Relative: 12 %
Lymphs Abs: 1.7 10*3/uL (ref 0.7–4.0)
MCH: 28.1 pg (ref 26.0–34.0)
MCHC: 32.9 g/dL (ref 30.0–36.0)
MCV: 85.4 fL (ref 80.0–100.0)
Monocytes Absolute: 2 10*3/uL — ABNORMAL HIGH (ref 0.1–1.0)
Monocytes Relative: 13 %
Neutro Abs: 10.8 10*3/uL — ABNORMAL HIGH (ref 1.7–7.7)
Neutrophils Relative %: 73 %
Platelets: 359 10*3/uL (ref 150–400)
RBC: 4.52 MIL/uL (ref 3.87–5.11)
RDW: 14 % (ref 11.5–15.5)
WBC: 14.6 10*3/uL — ABNORMAL HIGH (ref 4.0–10.5)
nRBC: 0 % (ref 0.0–0.2)

## 2022-02-04 LAB — CHLAMYDIA/NGC RT PCR (ARMC ONLY)
Chlamydia Tr: NOT DETECTED
N gonorrhoeae: NOT DETECTED

## 2022-02-04 LAB — COMPREHENSIVE METABOLIC PANEL
ALT: 25 U/L (ref 0–44)
AST: 24 U/L (ref 15–41)
Albumin: 3.1 g/dL — ABNORMAL LOW (ref 3.5–5.0)
Alkaline Phosphatase: 69 U/L (ref 38–126)
Anion gap: 10 (ref 5–15)
BUN: 10 mg/dL (ref 6–20)
CO2: 24 mmol/L (ref 22–32)
Calcium: 8.9 mg/dL (ref 8.9–10.3)
Chloride: 101 mmol/L (ref 98–111)
Creatinine, Ser: 0.68 mg/dL (ref 0.44–1.00)
GFR, Estimated: >60 mL/min (ref >60)
Glucose, Bld: 112 mg/dL — ABNORMAL HIGH (ref 70–99)
Potassium: 3.1 mmol/L — ABNORMAL LOW (ref 3.5–5.1)
Sodium: 135 mmol/L (ref 135–145)
Total Bilirubin: 0.4 mg/dL (ref 0.3–1.2)
Total Protein: 7.8 g/dL (ref 6.5–8.1)

## 2022-02-04 LAB — WET PREP, GENITAL
Sperm: NONE SEEN
Trich, Wet Prep: NONE SEEN
WBC, Wet Prep HPF POC: >=10 — AB (ref <10)
Yeast Wet Prep HPF POC: NONE SEEN

## 2022-02-04 LAB — LACTIC ACID, PLASMA
Lactic Acid, Venous: 0.9 mmol/L (ref 0.5–1.9)
Lactic Acid, Venous: 1 mmol/L (ref 0.5–1.9)

## 2022-02-04 LAB — LIPASE, BLOOD: Lipase: 32 U/L (ref 11–51)

## 2022-02-04 LAB — PREGNANCY, URINE: Preg Test, Ur: NEGATIVE

## 2022-02-04 MED ORDER — METRONIDAZOLE 500 MG PO TABS: 500.0000 mg | ORAL_TABLET | Freq: Two times a day (BID) | ORAL | 0 refills | Status: AC

## 2022-02-04 MED ORDER — METRONIDAZOLE 500 MG/100ML IV SOLN
500.0000 mg | Freq: Once | INTRAVENOUS | Status: DC
  Filled 2022-02-04: qty 100

## 2022-02-04 MED ORDER — ONDANSETRON 4 MG PO TBDP: 4.0000 mg | ORAL_TABLET | Freq: Three times a day (TID) | ORAL | 0 refills | Status: AC | PRN

## 2022-02-04 MED ORDER — SODIUM CHLORIDE 0.9 % IV BOLUS
1000.0000 mL | Freq: Once | INTRAVENOUS | Status: AC
  Administered 2022-02-04: 1000 mL via INTRAVENOUS

## 2022-02-04 MED ORDER — MORPHINE SULFATE (PF) 4 MG/ML IV SOLN
4.0000 mg | INTRAVENOUS | Status: DC | PRN
  Administered 2022-02-04: 4 mg via INTRAVENOUS
  Filled 2022-02-04: qty 1

## 2022-02-04 MED ORDER — ONDANSETRON HCL 4 MG/2ML IJ SOLN
4.0000 mg | Freq: Once | INTRAMUSCULAR | Status: AC
  Administered 2022-02-04: 4 mg via INTRAVENOUS
  Filled 2022-02-04: qty 2

## 2022-02-04 MED ORDER — DOXYCYCLINE HYCLATE 100 MG PO TABS: 100.0000 mg | ORAL_TABLET | Freq: Two times a day (BID) | ORAL | 0 refills | Status: AC

## 2022-02-04 MED ORDER — METRONIDAZOLE 500 MG PO TABS
500.0000 mg | ORAL_TABLET | Freq: Once | ORAL | Status: AC
  Administered 2022-02-04: 500 mg via ORAL
  Filled 2022-02-04: qty 1

## 2022-02-04 MED ORDER — OXYCODONE-ACETAMINOPHEN 5-325 MG PO TABS: 1.0000 | ORAL_TABLET | ORAL | 0 refills | Status: AC | PRN

## 2022-02-04 MED ORDER — SODIUM CHLORIDE 0.9 % IV SOLN
1.0000 g | Freq: Once | INTRAVENOUS | Status: AC
  Administered 2022-02-04: 1 g via INTRAVENOUS
  Filled 2022-02-04: qty 10

## 2022-02-04 NOTE — ED Notes (Signed)
89 yof with a c/c of lower abdominal pain since Sunday. The pt advised she has also been having back pain, blood in her urine and vomiting blood. The pt was warm, pink, and dry. The pt is alert and oriented x 4. The pt was ambulatory to the room without incident.

## 2022-02-04 NOTE — ED Provider Notes (Signed)
[facility] Provider Note    Event Date/Time   First MD Initiated Contact with Patient 02/04/22 1255     (approximate)   History   Flank Pain   HPI  Melissa Mcmillan is a 36 y.o. female with remote history of kidney stones presents to the ER for evaluation of 1 week of progressively worsening left lower quadrant flank pain associated with dysuria and hematuria.  Has had some chills no measured temperature.  We will beginning the week was having some vomiting which is since resolved.  No recent antibiotics.  Denies any chance of being pregnant.     Physical Exam   Triage Vital Signs: ED Triage Vitals  Enc Vitals Group     BP 02/04/22 1237 (!) 154/92     Pulse Rate 02/04/22 1237 (!) 113     Resp 02/04/22 1237 19     Temp 02/04/22 1237 99.8 F (37.7 C)     Temp Source 02/04/22 1237 Oral     SpO2 02/04/22 1237 96 %     Weight 02/04/22 1243 280 lb (127 kg)     Height 02/04/22 1304 6' (1.829 m)     Head Circumference --      Peak Flow --      Pain Score 02/04/22 1238 8     Pain Loc --      Pain Edu? --      Excl. in [place]? --     Most recent vital signs: Vitals:   02/04/22 1237 02/04/22 1635  BP: (!) 154/92 (!) 143/78  Pulse: (!) 113 66  Resp: 19 18  Temp: 99.8 F (37.7 C) 98.7 F (37.1 C)  SpO2: 96% 100%     Constitutional: Alert  Eyes: Conjunctivae are normal.  Head: Atraumatic. Nose: No congestion/rhinnorhea. Mouth/Throat: Mucous membranes are moist.   Neck: Painless ROM.  Cardiovascular:   Good peripheral circulation. Respiratory: Normal respiratory effort.  No retractions.  Gastrointestinal: Soft and nontender. Left cva ttp Pelvic exam chaperoned does show a left adnexal tenderness as well as some cervical motion tenderness but no right adnexal pain there is mild amount of  some brown cervical discharge Musculoskeletal:  no deformity Neurologic:  MAE spontaneously. No gross focal neurologic deficits are appreciated.  Skin:  Skin  is warm, dry and intact. No rash noted. Psychiatric: Mood and affect are normal. Speech and behavior are normal.    ED Results / Procedures / Treatments   Labs (all labs ordered are listed, but only abnormal results are displayed) Labs Reviewed  WET PREP, GENITAL - Abnormal; Notable for the following components:      Result Value   Clue Cells Wet Prep HPF POC PRESENT (*)    WBC, Wet Prep HPF POC >=10 (*)    All other components within normal limits  CBC WITH DIFFERENTIAL/PLATELET - Abnormal; Notable for the following components:   WBC 14.6 (*)    Neutro Abs 10.8 (*)    Monocytes Absolute 2.0 (*)    Abs Immature Granulocytes 0.09 (*)    All other components within normal limits  URINALYSIS, ROUTINE W REFLEX MICROSCOPIC - Abnormal; Notable for the following components:   Color, Urine YELLOW (*)    APPearance HAZY (*)    Hgb urine dipstick SMALL (*)    Ketones, ur 5 (*)    Protein, ur 100 (*)    Leukocytes,Ua SMALL (*)    Bacteria, UA RARE (*)    All other components within  normal limits  COMPREHENSIVE METABOLIC PANEL - Abnormal; Notable for the following components:   Potassium 3.1 (*)    Glucose, Bld 112 (*)    Albumin 3.1 (*)    All other components within normal limits  CHLAMYDIA/NGC RT PCR ([facility] ONLY)            LACTIC ACID, PLASMA  LACTIC ACID, PLASMA  PREGNANCY, URINE  LIPASE, BLOOD     EKG     RADIOLOGY Please see ED Course for my review and interpretation.  I personally reviewed all radiographic images ordered to evaluate for the above acute complaints and reviewed radiology reports and findings.  These findings were personally discussed with the patient.  Please see medical record for radiology report.    PROCEDURES:  Critical Care performed: No  Procedures   MEDICATIONS ORDERED IN ED: Medications  morphine (PF) 4 MG/ML injection 4 mg (4 mg Intravenous Given 02/04/22 1332)  metroNIDAZOLE (FLAGYL) IVPB 500 mg (has no administration in time range)   sodium chloride 0.9 % bolus 1,000 mL (1,000 mLs Intravenous New Bag/Given 02/04/22 1331)  ondansetron (ZOFRAN) injection 4 mg (4 mg Intravenous Given 02/04/22 1332)  cefTRIAXone (ROCEPHIN) 1 g in sodium chloride 0.9 % 100 mL IVPB (1 g Intravenous New Bag/Given 02/04/22 1632)     IMPRESSION / MDM / ASSESSMENT AND PLAN / ED COURSE  I reviewed the triage vital signs and the nursing notes.                              Differential diagnosis includes, but is not limited to, pyelonephritis, stone, colitis, enteritis, diverticulitis, cystitis, dehydration, electrolyte abnormality, ovarian pathology.  Patient presenting to the ER for evaluation of symptoms as described above.  Based on symptoms, risk factors and considered above differential, this presenting complaint could reflect a potentially life-threatening illness therefore the patient will be placed on continuous pulse oximetry and telemetry for monitoring.  Laboratory evaluation will be sent to evaluate for the above complaints.  CT imaging will be ordered for the above differential.  Will order IV fluids as well as IV morphine for pain as well as IV Zofran for nausea.   Clinical Course as of 02/04/22 1738  Fri Feb 04, 2022  1453 CT imaging on my review and interpretation does not show any evidence of stone will await formal radiology report. [PR]  1512 CT imaging concerning for pelvic etiology will order ultrasound to further evaluate.  I have ordered Rocephin, flagyl  [PR]  1735 Based on ultrasound imaging I discussed case in consultation with Dr. Logan Bores who has seen the patient back in 2022.  The right adnexal findings are likely chronic secondary to chronic obstruction.  I am empirically covering for PID but have a lower suspicion for TOA she is not having any pain on the right on exam all of her pain is on the left likely secondary to this large ovarian cyst.  She is not febrile at this time.  Discussed option including hospitalization  patient states that she would prefer trial of outpatient management and outpatient follow-up.  Patient tolerating p.o.  We discussed strict return precautions. [PR]    Clinical Course User Index [PR] Willy Eddy, MD      FINAL CLINICAL IMPRESSION(S) / ED DIAGNOSES   Final diagnoses:  Pelvic pain  Cyst of left ovary     Rx / DC Orders   ED Discharge Orders  Ordered    doxycycline (VIBRA-TABS) 100 MG tablet  2 times daily        02/04/22 1734    metroNIDAZOLE (FLAGYL) 500 MG tablet  2 times daily        02/04/22 1734    oxyCODONE-acetaminophen (PERCOCET) 5-325 MG tablet  Every 4 hours PRN        02/04/22 1734    ondansetron (ZOFRAN-ODT) 4 MG disintegrating tablet  Every 8 hours PRN        02/04/22 1734             Note:  This document was prepared using Dragon voice recognition software and may include unintentional dictation errors.    Willy Eddy, MD 02/04/22 [phone]

## 2022-02-04 NOTE — Discharge Instructions (Addendum)
You should return to the emergency department or see your primary care provider in 12-24hrs if your pain is no better andsooner if your pain becomes worse.

## 2022-02-04 NOTE — ED Triage Notes (Signed)
Pt arrives with c/o left sided flank pain that started Sunday. Pt endorses n/v, hematuria, and fever.

## 2022-02-15 ENCOUNTER — Encounter: Payer: Self-pay | Admitting: Obstetrics and Gynecology

## 2022-02-15 ENCOUNTER — Ambulatory Visit (INDEPENDENT_AMBULATORY_CARE_PROVIDER_SITE_OTHER): Payer: Self-pay | Admitting: Obstetrics and Gynecology

## 2022-02-15 VITALS — BP 136/95 | HR 114 | Ht 72.0 in | Wt 300.6 lb

## 2022-02-15 DIAGNOSIS — N7093 Salpingitis and oophoritis, unspecified: Secondary | ICD-10-CM

## 2022-02-15 DIAGNOSIS — N83201 Unspecified ovarian cyst, right side: Secondary | ICD-10-CM

## 2022-02-15 NOTE — Progress Notes (Signed)
HPI:      [name] is a 36 y.o. No obstetric history on file. who LMP was Patient's last menstrual period was 02/12/2022 (exact date).  Subjective:   She presents today because she was seen in the emergency department for left-sided pain.  She was found to have a left ovarian cyst on the right hydrosalpinx.  Patient had a similar right hydrosalpinx approximately a year and a half ago.  She has not had pain since that time.  She reports that her pain has improved since her emergency department visit but that she still experiences some left-sided pain. She does not think she has yet completed childbearing.  GC and chlamydia from ED negative    Hx: The following portions of the patient's history were reviewed and updated as appropriate:             She  has a past medical history of Cyst of fallopian tube, Depression, and Ovarian cyst. She does not have a problem list on file. She  has a past surgical history that includes Tubal ligation and OTHER SURGICAL HISTORY. Her family history is not on file. She  reports that she has quit smoking. She has never used smokeless tobacco. She reports current alcohol use. She reports that she does not use drugs. She has a current medication list which includes the following prescription(s): doxycycline, meloxicam, metronidazole, oxycodone-acetaminophen, sertraline, and ondansetron. She has No Known Allergies.       Review of Systems:  Review of Systems  Constitutional: Denied constitutional symptoms, night sweats, recent illness, fatigue, fever, insomnia and weight loss.  Eyes: Denied eye symptoms, eye pain, photophobia, vision change and visual disturbance.  Ears/Nose/Throat/Neck: Denied ear, nose, throat or neck symptoms, hearing loss, nasal discharge, sinus congestion and sore throat.  Cardiovascular: Denied cardiovascular symptoms, arrhythmia, chest pain/pressure, edema, exercise intolerance, orthopnea and palpitations.  Respiratory: Denied  pulmonary symptoms, asthma, pleuritic pain, productive sputum, cough, dyspnea and wheezing.  Gastrointestinal: Denied, gastro-esophageal reflux, melena, nausea and vomiting.  Genitourinary: See HPI for additional information.  Musculoskeletal: Denied musculoskeletal symptoms, stiffness, swelling, muscle weakness and myalgia.  Dermatologic: Denied dermatology symptoms, rash and scar.  Neurologic: Denied neurology symptoms, dizziness, headache, neck pain and syncope.  Psychiatric: Denied psychiatric symptoms, anxiety and depression.  Endocrine: Denied endocrine symptoms including hot flashes and night sweats.   Meds:   Current Outpatient Medications on File Prior to Visit  Medication Sig Dispense Refill   doxycycline (VIBRA-TABS) 100 MG tablet Take 1 tablet (100 mg total) by mouth 2 (two) times daily for 14 days. 28 tablet 0   meloxicam (MOBIC) 15 MG tablet Take 1 tablet (15 mg total) by mouth daily. 30 tablet 0   metroNIDAZOLE (FLAGYL) 500 MG tablet Take 1 tablet (500 mg total) by mouth 2 (two) times daily for 14 days. 28 tablet 0   oxyCODONE-acetaminophen (PERCOCET) 5-325 MG tablet Take 1 tablet by mouth every 4 (four) hours as needed for severe pain. 10 tablet 0   sertraline (ZOLOFT) 100 MG tablet Take 100 mg by mouth daily.     ondansetron (ZOFRAN-ODT) 4 MG disintegrating tablet Take 1 tablet (4 mg total) by mouth every 8 (eight) hours as needed for nausea or vomiting. (Patient not taking: Reported on 02/15/2022) 8 tablet 0   No current facility-administered medications on file prior to visit.      Objective:     Vitals:   02/15/22 0903  BP: (!) 136/95  Pulse: (!) 114   [employer]  02/15/22 0903  Weight: (!) 300 lb 9.6 oz (136.4 kg)              Ultrasound reports reviewed  CT reviewed          Assessment:    No obstetric history on file. There are no problems to display for this patient.    1. Cyst of right ovary   2. Pyosalpinx     I believe the patient's  pain is localized to her left ovarian cyst.  She has had the right hydrosalpinx for quite a while and it has not caused any issues in the last year and a half.  Doubt PID-pyosalpinx.  The cyst is somewhat complex but likely not worrisome for malignancy.  Patient can function daily with her current level of pain.   Plan:            1.  She will complete her course of antibiotics although I do not believe this is PID.  2.  We will plan follow-up ultrasound in 6 weeks and expect significant resolution of her left ovarian cyst as well as her pain.  3.  We have talked about future consideration for surgery on her right tube or possible referral to [facility] for infertility work-up -possible in vitro Orders Orders Placed This Encounter  Procedures   US PELVIS TRANSVAGINAL NON-OB (TV ONLY)   US PELVIS (TRANSABDOMINAL ONLY)    No orders of the defined types were placed in this encounter.     F/U  Return in about 6 weeks (around 03/29/2022). I spent 23 minutes involved in the care of this patient preparing to see the patient by obtaining and reviewing her medical history (including labs, imaging tests and prior procedures), documenting clinical information in the electronic health record (EHR), counseling and coordinating care plans, writing and sending prescriptions, ordering tests or procedures and in direct communicating with the patient and medical staff discussing pertinent items from her history and physical exam.  Elonda Husky, M.D. 02/15/2022 12:28 PM

## 2022-02-15 NOTE — Progress Notes (Signed)
Patient presents today for ED follow-up. She states she remains in pain on the left side but no issues on her right. No additional concerns.

## 2022-03-29 ENCOUNTER — Other Ambulatory Visit: Payer: Medicaid Other

## 2022-03-30 ENCOUNTER — Telehealth: Payer: Self-pay | Admitting: Obstetrics and Gynecology

## 2022-03-30 NOTE — Telephone Encounter (Signed)
Reached out to pt to reschedule [place] that was scheduled for 03/29/22 at 4:00.  Was able to reschedule to 04/20/22 at 4:00.

## 2022-04-12 ENCOUNTER — Ambulatory Visit: Payer: 59 | Admitting: Obstetrics and Gynecology

## 2022-04-20 ENCOUNTER — Other Ambulatory Visit: Payer: 59

## 2022-05-05 ENCOUNTER — Other Ambulatory Visit: Payer: 59

## 2022-05-06 ENCOUNTER — Ambulatory Visit: Admission: RE | Admit: 2022-05-06 | Payer: 59 | Source: Ambulatory Visit

## 2022-05-13 ENCOUNTER — Ambulatory Visit: Payer: 59

## 2023-12-12 ENCOUNTER — Other Ambulatory Visit
